# Patient Record
Sex: Female | Born: 1990 | Race: Black or African American | Hispanic: No | Marital: Single | Smoking: Never smoker
Health system: Southern US, Community
[De-identification: ages and names within clinical notes are randomized; demographics above are authoritative.]

## PROBLEM LIST (undated history)

## (undated) ENCOUNTER — Inpatient Hospital Stay (HOSPITAL_COMMUNITY): Payer: Self-pay

## (undated) DIAGNOSIS — O0990 Supervision of high risk pregnancy, unspecified, unspecified trimester: Secondary | ICD-10-CM

## (undated) DIAGNOSIS — E119 Type 2 diabetes mellitus without complications: Secondary | ICD-10-CM

## (undated) DIAGNOSIS — I10 Essential (primary) hypertension: Secondary | ICD-10-CM

## (undated) DIAGNOSIS — O24419 Gestational diabetes mellitus in pregnancy, unspecified control: Secondary | ICD-10-CM

## (undated) HISTORY — DX: Supervision of high risk pregnancy, unspecified, unspecified trimester: O09.90

## (undated) HISTORY — PX: NO PAST SURGERIES: SHX2092

---

## 2015-08-20 DIAGNOSIS — O24419 Gestational diabetes mellitus in pregnancy, unspecified control: Secondary | ICD-10-CM

## 2015-08-20 HISTORY — DX: Gestational diabetes mellitus in pregnancy, unspecified control: O24.419

## 2015-08-20 NOTE — L&D Delivery Note (Signed)
Delivery Note Pt progressed nicely through transition without an epidural.  She had a 10 minute 2nd stage. At 7:16 AM a viable female was delivered via  (Presentation:OA restituted to LOA;  ).  APGAR: ,8 ; 9 weight pending .  Blood pressures remain elevated but out of the severe range.  Placenta status: intact, delivered via Tomasa BlaseSchultz .  Cord: three vessels with the following complications: none.  Cord pH: N/A  Anesthesia:  none Episiotomy:  N/A Lacerations:  intact Suture Repair: N/A Est. Blood Loss (mL):  50  Mom to postpartum.  Baby to Couplet care / Skin to Skin.  Breast Outpatient circ Contraception: OCP Pt to take placenta home  Clayton BiblesSamantha Gay, Alicia CarolinaNM 07/19/2016, 7:27 AM  I was present for the above and agree.

## 2016-01-19 ENCOUNTER — Encounter (HOSPITAL_COMMUNITY): Payer: Self-pay | Admitting: Emergency Medicine

## 2016-01-19 ENCOUNTER — Emergency Department (HOSPITAL_COMMUNITY): Payer: Medicaid Other

## 2016-01-19 ENCOUNTER — Encounter: Payer: Self-pay | Admitting: Certified Nurse Midwife

## 2016-01-19 ENCOUNTER — Emergency Department (HOSPITAL_COMMUNITY)
Admission: EM | Admit: 2016-01-19 | Discharge: 2016-01-19 | Disposition: A | Discharge status: Home / Self Care | Payer: Medicaid Other | Attending: Emergency Medicine | Admitting: Emergency Medicine

## 2016-01-19 DIAGNOSIS — O26899 Other specified pregnancy related conditions, unspecified trimester: Secondary | ICD-10-CM

## 2016-01-19 DIAGNOSIS — R1033 Periumbilical pain: Secondary | ICD-10-CM | POA: Insufficient documentation

## 2016-01-19 DIAGNOSIS — O26892 Other specified pregnancy related conditions, second trimester: Secondary | ICD-10-CM | POA: Diagnosis present

## 2016-01-19 DIAGNOSIS — Z3A15 15 weeks gestation of pregnancy: Secondary | ICD-10-CM | POA: Insufficient documentation

## 2016-01-19 DIAGNOSIS — R52 Pain, unspecified: Secondary | ICD-10-CM

## 2016-01-19 DIAGNOSIS — R1030 Lower abdominal pain, unspecified: Secondary | ICD-10-CM

## 2016-01-19 LAB — CBC
HCT: 39.2 % (ref 36.0–46.0)
HEMOGLOBIN: 12.8 g/dL (ref 12.0–15.0)
MCH: 26.3 pg (ref 26.0–34.0)
MCHC: 32.7 g/dL (ref 30.0–36.0)
MCV: 80.7 fL (ref 78.0–100.0)
Platelets: 307 10*3/uL (ref 150–400)
RBC: 4.86 MIL/uL (ref 3.87–5.11)
RDW: 12.3 % (ref 11.5–15.5)
WBC: 6.7 10*3/uL (ref 4.0–10.5)

## 2016-01-19 LAB — COMPREHENSIVE METABOLIC PANEL
ALBUMIN: 3.4 g/dL — AB (ref 3.5–5.0)
ALK PHOS: 25 U/L — AB (ref 38–126)
ALT: 23 U/L (ref 14–54)
ANION GAP: 7 (ref 5–15)
AST: 24 U/L (ref 15–41)
BILIRUBIN TOTAL: 0.5 mg/dL (ref 0.3–1.2)
BUN: 6 mg/dL (ref 6–20)
CALCIUM: 9.3 mg/dL (ref 8.9–10.3)
CO2: 23 mmol/L (ref 22–32)
CREATININE: 0.7 mg/dL (ref 0.44–1.00)
Chloride: 105 mmol/L (ref 101–111)
GFR calc Af Amer: >60 mL/min (ref >60)
GFR calc non Af Amer: >60 mL/min (ref >60)
GLUCOSE: 75 mg/dL (ref 65–99)
Potassium: 3.8 mmol/L (ref 3.5–5.1)
SODIUM: 135 mmol/L (ref 135–145)
TOTAL PROTEIN: 6.8 g/dL (ref 6.5–8.1)

## 2016-01-19 LAB — WET PREP, GENITAL
Sperm: NONE SEEN
Trich, Wet Prep: NONE SEEN
Yeast Wet Prep HPF POC: NONE SEEN

## 2016-01-19 LAB — OB RESULTS CONSOLE GC/CHLAMYDIA: Gonorrhea: NEGATIVE

## 2016-01-19 LAB — URINALYSIS, ROUTINE W REFLEX MICROSCOPIC
BILIRUBIN URINE: NEGATIVE
Glucose, UA: NEGATIVE mg/dL
HGB URINE DIPSTICK: NEGATIVE
KETONES UR: NEGATIVE mg/dL
Leukocytes, UA: NEGATIVE
Nitrite: NEGATIVE
PH: 7.5 (ref 5.0–8.0)
Protein, ur: NEGATIVE mg/dL
SPECIFIC GRAVITY, URINE: 1.01 (ref 1.005–1.030)

## 2016-01-19 LAB — HCG, QUANTITATIVE, PREGNANCY: hCG, Beta Chain, Quant, S: 78410 m[IU]/mL — ABNORMAL HIGH (ref <5)

## 2016-01-19 LAB — LIPASE, BLOOD: Lipase: 29 U/L (ref 11–51)

## 2016-01-19 MED ORDER — METRONIDAZOLE 500 MG PO TABS: 500.0000 mg | ORAL_TABLET | Freq: Two times a day (BID) | ORAL | Status: DC

## 2016-01-19 NOTE — ED Provider Notes (Signed)
CSN: [id number]     Arrival date & time 01/19/16  1112 History   First MD Initiated Contact with Patient 01/19/16 1223     Chief Complaint  Patient presents with  . Abdominal Pain     (Consider location/radiation/quality/duration/timing/severity/associated sxs/prior Treatment) HPI Comments: Patient is a previously healthy 25 year old pregnant female who presents with lower abdominal pain. Patient states she has had worsening, constant, crampy abdominal pain over the past 2-3 days. The pain does not radiate. Patient denies any abnormal vaginal discharge or bleeding. Patient reports that on the way here patient coughed which worsened the pain increase the pressure. Patient denies any nausea, vomiting, diarrhea. Patient reports that she is [redacted] weeks pregnant, however she has not yet seen an OB/GYN. She has no appointment in 2 weeks. Patient has had a normal appetite. Patient denies any urinary symptoms. Patient has not taken any medications at home for this problem. Patient has been had 2 weeks of sinus congestion and productive cough with clear mucous. Patient has been taking saline solution per recommendation of her OBGYN nurse when she called the office. Patient denies any chest pain, shortness of breath, fevers.  Patient is a 25 y.o. female presenting with abdominal pain. The history is provided by the patient.  Abdominal Pain Associated symptoms: no chest pain, no chills, no diarrhea, no dysuria, no fever, no nausea, no shortness of breath, no sore throat and no vomiting     History reviewed. No pertinent past medical history. History reviewed. No pertinent past surgical history. History reviewed. No pertinent family history. Social History  Substance Use Topics  . Smoking status: Never Smoker   . Smokeless tobacco: None  . Alcohol Use: No   OB History    Gravida Para Term Preterm AB TAB SAB Ectopic Multiple Living   1              Review of Systems  Constitutional: Negative for fever  and chills.  HENT: Negative for facial swelling and sore throat.   Respiratory: Negative for shortness of breath.   Cardiovascular: Negative for chest pain.  Gastrointestinal: Positive for abdominal pain. Negative for nausea, vomiting and diarrhea.  Genitourinary: Negative for dysuria.  Musculoskeletal: Negative for back pain.  Skin: Negative for rash and wound.  Neurological: Negative for headaches.  Psychiatric/Behavioral: The patient is not nervous/anxious.       Allergies  Review of patient's allergies indicates no known allergies.  Home Medications   Prior to Admission medications   Medication Sig Start Date End Date Taking? Authorizing Provider  metroNIDAZOLE (FLAGYL) 500 MG tablet Take 1 tablet (500 mg total) by mouth 2 (two) times daily. 01/19/16   Emi Holes, PA-C  Prenatal Vit-Fe Fumarate-FA (PRENATAL MULTIVITAMIN) TABS tablet Take 1 tablet by mouth daily at 12 noon.   Yes Historical Provider, MD   BP 115/92 mmHg  Pulse 60  Temp(Src) 97.9 F (36.6 C) (Oral)  Resp 16  Ht 5' 7.5" (1.715 m)  Wt 88.451 kg  BMI 30.07 kg/m2  SpO2 100%  LMP 09/28/2015 Physical Exam  Constitutional: She appears well-developed and well-nourished. No distress.  HENT:  Head: Normocephalic and atraumatic.  Mouth/Throat: Oropharynx is clear and moist. No oropharyngeal exudate.  Eyes: Conjunctivae are normal. Pupils are equal, round, and reactive to light. Right eye exhibits no discharge. Left eye exhibits no discharge. No scleral icterus.  Neck: Normal range of motion. Neck supple. No thyromegaly present.  Cardiovascular: Normal rate, regular rhythm, normal heart sounds and intact distal  pulses.  Exam reveals no gallop and no friction rub.   No murmur heard. Pulmonary/Chest: Effort normal and breath sounds normal. No stridor. No respiratory distress. She has no wheezes. She has no rales.  Abdominal: Soft. Bowel sounds are normal. She exhibits no distension. There is tenderness in the  periumbilical area and suprapubic area. There is no rebound, no guarding and no CVA tenderness.    Musculoskeletal: She exhibits no edema.  Lymphadenopathy:    She has no cervical adenopathy.  Neurological: She is alert. Coordination normal.  Skin: Skin is warm and dry. No rash noted. She is not diaphoretic. No pallor.  Psychiatric: She has a normal mood and affect.  Nursing note and vitals reviewed.   ED Course  Procedures (including critical care time) Labs Review Labs Reviewed  WET PREP, GENITAL - Abnormal; Notable for the following:    Clue Cells Wet Prep HPF POC PRESENT (*)    WBC, Wet Prep HPF POC MANY (*)    All other components within normal limits  COMPREHENSIVE METABOLIC PANEL - Abnormal; Notable for the following:    Albumin 3.4 (*)    Alkaline Phosphatase 25 (*)    All other components within normal limits  HCG, QUANTITATIVE, PREGNANCY - Abnormal; Notable for the following:    hCG, Beta Chain, Quant, S 16109 (*)    All other components within normal limits  URINE CULTURE  LIPASE, BLOOD  CBC  URINALYSIS, ROUTINE W REFLEX MICROSCOPIC (NOT AT [facility])  RPR  HIV ANTIBODY (ROUTINE TESTING)  GC/CHLAMYDIA PROBE AMP ([place]) NOT AT [facility]    Imaging Review US Ob Limited  01/19/2016  CLINICAL DATA:  Lower abdominal pain and cramping for 3 days. Gestational age by LMP of 16 weeks 1 day. EXAM: LIMITED OBSTETRIC ULTRASOUND FINDINGS: Number of Fetuses:  Single Heart Rate:  162 bpm Movement:  Yes Presentation: Variable Placental Location: Anterior Previa: Complete placenta previa noted Amniotic Fluid (Subjective):  Within normal limits. BPD:  2.6cm 14w  4d MATERNAL FINDINGS: Cervix:  Appears closed. Uterus/Adnexae: No abnormality visualized. Ovaries not directly visualized, however no adnexal mass or free fluid demonstrated. IMPRESSION: Single living IUP at approximately [redacted] weeks gestational age. No acute maternal abnormality visualized. Complete placenta previa noted, which may be  related to early gestational age. Recommend followup at time of second trimester complete OB ultrasound exam. This exam is performed on an emergent basis and does not comprehensively evaluate fetal size, dating, or anatomy; follow-up complete OB US is recommended. Electronically Signed   By: Myles Rosenthal M.D.   On: 01/19/2016 17:17   I have personally reviewed and evaluated these images and lab results as part of my medical decision-making.   EKG Interpretation None      MDM   CBC unremarkable. CMP shows alkaline phosphatase 25, albumin 3.4. Lipase 29. UA negative, urine culture sent. GC chlamydia, RPR, HIV sent. Patient advised she would be called for any positive results in 2-3 days at which time treatment would be arranged. HCG Quant [id number]. Wet prep shows clue cells and many white blood cells. OB ultrasound shows single living IUP at approximately 53 weeks of age, no acute maternal abnormality visualized, complete placenta previa noted which may be related to early gestational age; recommend follow-up at time of second trimester complete OB ultrasound exam. I will treat patient for possible vaginitis with Flagyl. Patient to follow-up with OB/GYN. Patient vitals stable throughout ED course and discharged in satisfactory condition. I discussed patient with Dr. Effie Shy who  is in agreement with plan.  Final diagnoses:  Pregnancy related abdominal pain of lower quadrant, antepartum        Emi Holeslexandra M Latrease Kunde, PA-C 01/19/16 1855  Mancel BaleElliott Wentz, MD 01/20/16 [phone]

## 2016-01-19 NOTE — ED Notes (Signed)
Patient getting undressed and into a gown at this time 

## 2016-01-19 NOTE — Discharge Instructions (Signed)
Medications: Flagyl  Treatment: Take Flagyl as prescribed for 1 week. Do not drink alcohol while taking this medication. You may take Tylenol over-the-counter for your pain. You will be called in 2-3 days if any of your cultures returned positive.  Follow-up: Please follow-up with your OB/GYN doctor for follow-up of today's visit and further evaluation of your pain. Please return [facility] if you develop any new or worsening symptoms.

## 2016-01-19 NOTE — ED Notes (Signed)
Chaperoned Gordy CouncilmanAlexandra, PA with pelvic examination and collection of vaginal samples

## 2016-01-19 NOTE — ED Notes (Signed)
Pt returned from ultrasound. Will update patient when results come back.

## 2016-01-19 NOTE — ED Notes (Signed)
Pt here with lower abdominal pain x 2 days. Pt is [redacted] weeks pregnant with regular prenatal care. Pt denies n/v/d/ fever. Pt Denies vaginal discharge ot bleeding.

## 2016-01-20 LAB — URINE CULTURE: Culture: NO GROWTH

## 2016-01-20 LAB — RPR: RPR Ser Ql: NONREACTIVE

## 2016-01-20 LAB — HIV ANTIBODY (ROUTINE TESTING W REFLEX): HIV Screen 4th Generation wRfx: NONREACTIVE

## 2016-01-22 LAB — GC/CHLAMYDIA PROBE AMP (~~LOC~~) NOT AT ARMC
Chlamydia: NEGATIVE
Neisseria Gonorrhea: NEGATIVE

## 2016-02-02 ENCOUNTER — Encounter: Payer: Self-pay | Admitting: Certified Nurse Midwife

## 2016-02-02 ENCOUNTER — Ambulatory Visit (INDEPENDENT_AMBULATORY_CARE_PROVIDER_SITE_OTHER): Payer: BLUE CROSS/BLUE SHIELD | Admitting: Certified Nurse Midwife

## 2016-02-02 DIAGNOSIS — O0932 Supervision of pregnancy with insufficient antenatal care, second trimester: Secondary | ICD-10-CM

## 2016-02-02 DIAGNOSIS — Z3492 Encounter for supervision of normal pregnancy, unspecified, second trimester: Secondary | ICD-10-CM | POA: Diagnosis not present

## 2016-02-02 DIAGNOSIS — O0990 Supervision of high risk pregnancy, unspecified, unspecified trimester: Secondary | ICD-10-CM | POA: Insufficient documentation

## 2016-02-02 DIAGNOSIS — Z3402 Encounter for supervision of normal first pregnancy, second trimester: Secondary | ICD-10-CM

## 2016-02-02 LAB — POCT URINALYSIS DIPSTICK
Bilirubin, UA: NEGATIVE
Blood, UA: NEGATIVE
Glucose, UA: NEGATIVE
Ketones, UA: NEGATIVE
LEUKOCYTES UA: NEGATIVE
Nitrite, UA: NEGATIVE
PH UA: 7.5
PROTEIN UA: NEGATIVE
Spec Grav, UA: 1.015
UROBILINOGEN UA: NEGATIVE

## 2016-02-02 NOTE — Progress Notes (Signed)
Subjective:    Alicia Gay is being seen today for her first obstetrical visit.  This is not a planned pregnancy. She is at 7149w4d gestation. Her obstetrical history is significant for normal. Relationship with FOB: significant other, living together, [place]. Patient does intend to breast feed. Pregnancy history fully reviewed.  The information documented in the HPI was reviewed and verified.  Menstrual History: OB History    Gravida Para Term Preterm AB TAB SAB Ectopic Multiple Living   1               Menarche age: 25 years of age   Patient's last menstrual period was 09/28/2015 (lmp unknown).    No past medical history on file.  No past surgical history on file.   (Not in a hospital admission) No Known Allergies  Social History  Substance Use Topics  . Smoking status: Never Smoker   . Smokeless tobacco: Not on file  . Alcohol Use: No    No family history on file.   Review of Systems Constitutional: negative for weight loss Gastrointestinal: negative for vomiting Genitourinary:negative for genital lesions and vaginal discharge and dysuria Musculoskeletal:negative for back pain Behavioral/Psych: negative for abusive relationship, depression, illegal drug usage and tobacco use    Objective:    LMP 09/28/2015 (LMP Unknown) General Appearance:    Alert, cooperative, no distress, appears stated age  Head:    Normocephalic, without obvious abnormality, atraumatic  Eyes:    PERRL, conjunctiva/corneas clear, EOM's intact, fundi    benign, both eyes  Ears:    Normal TM's and external ear canals, both ears  Nose:   Nares normal, septum midline, mucosa normal, no drainage    or sinus tenderness  Throat:   Lips, mucosa, and tongue normal; teeth and gums normal  Neck:   Supple, symmetrical, trachea midline, no adenopathy;    thyroid:  no enlargement/tenderness/nodules; no carotid   bruit or JVD  Back:     Symmetric, no curvature, ROM normal, no CVA tenderness  Lungs:      Clear to auscultation bilaterally, respirations unlabored  Chest Wall:    No tenderness or deformity   Heart:    Regular rate and rhythm, S1 and S2 normal, no murmur, rub   or gallop  Breast Exam:    No tenderness, masses, or nipple abnormality  Abdomen:     Soft, non-tender, bowel sounds active all four quadrants,    no masses, no organomegaly  Genitalia:    Normal female without lesion, discharge or tenderness  Extremities:   Extremities normal, atraumatic, no cyanosis or edema  Pulses:   2+ and symmetric all extremities  Skin:   Skin color, texture, turgor normal, no rashes or lesions  Lymph nodes:   Cervical, supraclavicular, and axillary nodes normal  Neurologic:   CNII-XII intact, normal strength, sensation and reflexes    throughout     Cervix:  Long, thick, closed and posterior.  FHR: 155   FH: roughly 18 cm       Lab Review Urine pregnancy test Labs reviewed yes Radiologic studies reviewed yes Assessment:    Pregnancy at 7249w4d weeks   Late to prenatal care  Plan:      Prenatal vitamins.  Counseling provided regarding continued use of seat belts, cessation of alcohol consumption, smoking or use of illicit drugs; infection precautions i.e., influenza/TDAP immunizations, toxoplasmosis,CMV, parvovirus, listeria and varicella; workplace safety, exercise during pregnancy; routine dental care, safe medications, sexual activity, hot tubs, saunas, pools, travel,  caffeine use, fish and methlymercury, potential toxins, hair treatments, varicose veins Weight gain recommendations per IOM guidelines reviewed: underweight/BMI< 18.5--> gain 28 - 40 lbs; normal weight/BMI 18.5 - 24.9--> gain 25 - 35 lbs; overweight/BMI 25 - 29.9--> gain 15 - 25 lbs; obese/BMI >30->gain  11 - 20 lbs Problem list reviewed and updated. FIRST/CF mutation testing/NIPT/QUAD SCREEN/fragile X/Ashkenazi Jewish population testing/Spinal muscular atrophy discussed: ordered. Role of ultrasound in pregnancy discussed;  fetal survey: ordered. Amniocentesis discussed: not indicated. VBAC calculator score: VBAC consent form provided No orders of the defined types were placed in this encounter.   Orders Placed This Encounter  Procedures  . US OB Comp + 14 Wk    Standing Status: Future     Number of Occurrences:      Standing Expiration Date: 04/03/2017    Order Specific Question:  Reason for Exam (SYMPTOM  OR DIAGNOSIS REQUIRED)    Answer:  fetal anatomy scan    Order Specific Question:  Preferred imaging location?    Answer:  Internal  . TSH  . Hemoglobinopathy evaluation  . Varicella zoster antibody, IgG  . Prenatal Profile I  . POCT urinalysis dipstick    Follow up in 4 weeks. 50% of 30 min visit spent on counseling and coordination of care.

## 2016-02-05 LAB — PRENATAL PROFILE I(LABCORP)
Antibody Screen: NEGATIVE
BASOS ABS: 0 10*3/uL (ref 0.0–0.2)
Basos: 0 %
EOS (ABSOLUTE): 0.1 10*3/uL (ref 0.0–0.4)
Eos: 1 %
Hematocrit: 37.7 % (ref 34.0–46.6)
Hemoglobin: 12.2 g/dL (ref 11.1–15.9)
Hepatitis B Surface Ag: NEGATIVE
Immature Grans (Abs): 0 10*3/uL (ref 0.0–0.1)
Immature Granulocytes: 0 %
LYMPHS ABS: 1.1 10*3/uL (ref 0.7–3.1)
Lymphs: 18 %
MCH: 26.7 pg (ref 26.6–33.0)
MCHC: 32.4 g/dL (ref 31.5–35.7)
MCV: 83 fL (ref 79–97)
Monocytes Absolute: 0.5 10*3/uL (ref 0.1–0.9)
Monocytes: 8 %
NEUTROS ABS: 4.5 10*3/uL (ref 1.4–7.0)
Neutrophils: 73 %
PLATELETS: 302 10*3/uL (ref 150–379)
RBC: 4.57 x10E6/uL (ref 3.77–5.28)
RDW: 13.2 % (ref 12.3–15.4)
RPR Ser Ql: NONREACTIVE
Rh Factor: POSITIVE
Rubella Antibodies, IGG: 9.68 index (ref >0.99)
WBC: 6.2 10*3/uL (ref 3.4–10.8)

## 2016-02-05 LAB — TSH: TSH: 2.27 u[IU]/mL (ref 0.450–4.500)

## 2016-02-05 LAB — HEMOGLOBINOPATHY EVALUATION
HEMOGLOBIN F QUANTITATION: 0 % (ref 0.0–2.0)
HGB A: 97.6 % (ref 94.0–98.0)
HGB C: 0 %
HGB S: 0 %
Hemoglobin A2 Quantitation: 2.4 % (ref 0.7–3.1)

## 2016-02-05 LAB — NUSWAB VG, CANDIDA 6SP
CANDIDA ALBICANS, NAA: POSITIVE — AB
CANDIDA GLABRATA, NAA: NEGATIVE
CANDIDA KRUSEI, NAA: NEGATIVE
CANDIDA PARAPSILOSIS, NAA: NEGATIVE
CANDIDA TROPICALIS, NAA: NEGATIVE
Candida lusitaniae, NAA: NEGATIVE
Trich vag by NAA: NEGATIVE

## 2016-02-05 LAB — VARICELLA ZOSTER ANTIBODY, IGG: Varicella zoster IgG: 1088 index (ref >165)

## 2016-02-07 ENCOUNTER — Other Ambulatory Visit: Payer: Self-pay | Admitting: Certified Nurse Midwife

## 2016-02-07 DIAGNOSIS — B373 Candidiasis of vulva and vagina: Secondary | ICD-10-CM

## 2016-02-07 DIAGNOSIS — B3731 Acute candidiasis of vulva and vagina: Secondary | ICD-10-CM

## 2016-02-07 MED ORDER — TERCONAZOLE 0.8 % VA CREA: 1.0000 | TOPICAL_CREAM | Freq: Every day | VAGINAL | Status: DC

## 2016-02-07 MED ORDER — FLUCONAZOLE 100 MG PO TABS: 100.0000 mg | ORAL_TABLET | Freq: Once | ORAL | Status: DC

## 2016-02-08 LAB — PAP IG W/ RFLX HPV ASCU: PAP SMEAR COMMENT: 0

## 2016-02-08 LAB — HPV DNA PROBE HIGH RISK, AMPLIFIED: HPV, HIGH-RISK: POSITIVE — AB

## 2016-02-13 ENCOUNTER — Other Ambulatory Visit: Payer: Self-pay | Admitting: Certified Nurse Midwife

## 2016-02-15 ENCOUNTER — Ambulatory Visit (INDEPENDENT_AMBULATORY_CARE_PROVIDER_SITE_OTHER): Payer: BLUE CROSS/BLUE SHIELD

## 2016-02-15 DIAGNOSIS — Z3402 Encounter for supervision of normal first pregnancy, second trimester: Secondary | ICD-10-CM

## 2016-02-15 DIAGNOSIS — Z36 Encounter for antenatal screening of mother: Secondary | ICD-10-CM

## 2016-02-21 ENCOUNTER — Other Ambulatory Visit (HOSPITAL_COMMUNITY): Payer: Self-pay | Admitting: Certified Nurse Midwife

## 2016-02-22 ENCOUNTER — Other Ambulatory Visit: Payer: Self-pay | Admitting: Certified Nurse Midwife

## 2016-03-01 ENCOUNTER — Ambulatory Visit (INDEPENDENT_AMBULATORY_CARE_PROVIDER_SITE_OTHER): Payer: BLUE CROSS/BLUE SHIELD | Admitting: Obstetrics

## 2016-03-01 VITALS — BP 144/89 | HR 105 | Temp 98.2°F | Wt 199.0 lb

## 2016-03-01 DIAGNOSIS — Z3402 Encounter for supervision of normal first pregnancy, second trimester: Secondary | ICD-10-CM

## 2016-03-01 LAB — POCT URINALYSIS DIPSTICK
BILIRUBIN UA: NEGATIVE
Blood, UA: NEGATIVE
GLUCOSE UA: NORMAL
KETONES UA: NEGATIVE
LEUKOCYTES UA: NEGATIVE
Nitrite, UA: NEGATIVE
PH UA: 7
Protein, UA: NEGATIVE
Spec Grav, UA: <=1.005
Urobilinogen, UA: NEGATIVE

## 2016-03-01 MED ORDER — PRENATE MINI 29-0.6-0.4-350 MG PO CAPS: 1.0000 | ORAL_CAPSULE | Freq: Every day | ORAL | Status: DC

## 2016-03-04 ENCOUNTER — Encounter: Payer: Self-pay | Admitting: Obstetrics

## 2016-03-04 NOTE — Progress Notes (Signed)
Subjective:    Alicia Gay is a 25 y.o. female being seen today for her obstetrical visit. She is at 3094w0d gestation. Patient reports: no complaints . Fetal movement: normal.  Problem List Items Addressed This Visit    Supervision of normal first pregnancy in second trimester - Primary   Relevant Medications   Prenat w/o A-FeCbn-Meth-FA-DHA (PRENATE MINI) 29-0.6-0.4-350 MG CAPS   Other Relevant Orders   POCT urinalysis dipstick (Completed)     Patient Active Problem List   Diagnosis Date Noted  . Supervision of normal first pregnancy in second trimester 02/02/2016   Objective:    BP 144/89 mmHg  Pulse 105  Temp(Src) 98.2 F (36.8 C)  Wt 199 lb (90.266 kg)  LMP 09/28/2015 (LMP Unknown) FHT: 150 BPM  Uterine Size: size equals dates     Assessment:    Pregnancy @ 8194w0d    Plan:    Signs and symptoms of preterm labor: discussed.  Labs, problem list reviewed and updated 2 hr GTT planned Follow up in 4 weeks.

## 2016-03-29 ENCOUNTER — Encounter: Payer: BLUE CROSS/BLUE SHIELD | Admitting: Certified Nurse Midwife

## 2016-04-04 ENCOUNTER — Ambulatory Visit (INDEPENDENT_AMBULATORY_CARE_PROVIDER_SITE_OTHER): Payer: BLUE CROSS/BLUE SHIELD | Admitting: Certified Nurse Midwife

## 2016-04-04 DIAGNOSIS — O132 Gestational [pregnancy-induced] hypertension without significant proteinuria, second trimester: Secondary | ICD-10-CM

## 2016-04-04 DIAGNOSIS — Z3402 Encounter for supervision of normal first pregnancy, second trimester: Secondary | ICD-10-CM

## 2016-04-04 DIAGNOSIS — O162 Unspecified maternal hypertension, second trimester: Secondary | ICD-10-CM | POA: Insufficient documentation

## 2016-04-04 LAB — POCT URINALYSIS DIPSTICK
BILIRUBIN UA: NEGATIVE
GLUCOSE UA: NEGATIVE
KETONES UA: NEGATIVE
Leukocytes, UA: NEGATIVE
Nitrite, UA: NEGATIVE
Protein, UA: NEGATIVE
RBC UA: NEGATIVE
UROBILINOGEN UA: NEGATIVE
pH, UA: 7

## 2016-04-04 MED ORDER — NIFEDIPINE ER OSMOTIC RELEASE 30 MG PO TB24: 30.0000 mg | ORAL_TABLET | Freq: Every day | ORAL | 5 refills | Status: DC

## 2016-04-04 MED ORDER — ASPIRIN 81 MG PO CHEW: 81.0000 mg | CHEWABLE_TABLET | Freq: Every day | ORAL | 5 refills | Status: DC

## 2016-04-04 NOTE — Progress Notes (Signed)
Subjective:    Alicia Gay is a 25 y.o. female being seen today for her obstetrical visit. She is at 2519w3d gestation. Patient reports: no complaints . Fetal movement: normal.  Problem List Items Addressed This Visit      Other   Supervision of normal first pregnancy in second trimester   Relevant Orders   POCT urinalysis dipstick (Completed)   Elevated blood pressure affecting pregnancy in second trimester, antepartum   Relevant Medications   NIFEdipine (PROCARDIA XL) 30 MG 24 hr tablet   aspirin 81 MG chewable tablet   Other Relevant Orders   Lactate dehydrogenase   Creatinine, serum   CBC   ALT   AST   Pathologist smear review   Protein / creatinine ratio, urine   Creatinine clearance, urine, 24 hour   Protein, urine, 24 hour   AMB referral to maternal fetal medicine   US MFM OB DETAIL +14 WK    Other Visit Diagnoses   None.    Patient Active Problem List   Diagnosis Date Noted  . Elevated blood pressure affecting pregnancy in second trimester, antepartum 04/04/2016  . Supervision of normal first pregnancy in second trimester 02/02/2016   Objective:    BP (!) 146/93   Pulse 64   Temp 98.5 F (36.9 C)   Wt 203 lb (92.1 kg)   LMP 09/28/2015 (LMP Unknown)   BMI 31.33 kg/m  FHT: 155 BPM  Uterine Size: 25 cm and size equals dates     Assessment:    Pregnancy @ 7819w3d    Elevated blood pressure: base line labs, meds, MFM consult done  Plan:    OBGCT: discussed and ordered for next visit. Signs and symptoms of preterm labor: discussed. elevated blood pressure X2 no maternal hx of HTN: started on procardia xl, baby asa, MFM consult  Labs, problem list reviewed and updated 2 hr GTT planned Follow up in 2 weeks.

## 2016-04-04 NOTE — Patient Instructions (Addendum)
Before [facility] Before your baby arrives it is important to:  Have all of the supplies that you will need to care for your baby.  Know where to go if there is an emergency.  Discuss the baby's arrival with other family members. WHAT SUPPLIES WILL I NEED? It is recommended that you have the following supplies: Large Items  Crib.  Crib mattress.  Rear-facing infant car seat. If possible, have a trained professional check to make sure that it is installed correctly. Feeding  6-8 bottles that are 4-5 oz in size.  6-8 nipples.  Bottle brush.  Sterilizer, or a large pan or kettle with a lid.  A way to boil and cool water.  If you will be breastfeeding:  Breast pump.  Nipple cream.  Nursing bra.  Breast pads.  Breast shields.  If you will be formula feeding:  Formula.  Measuring cups.  Measuring spoons. Bathing  Mild baby soap and baby shampoo.  Petroleum jelly.  Soft cloth towel and washcloth.  Hooded towel.  Cotton balls.  Bath basin. Other Supplies  Rectal thermometer.  Bulb syringe.  Baby wipes or washcloths for diaper changes.  Diaper bag.  Changing pad.  Clothing, including one-piece outfits and pajamas.  Baby nail clippers.  Receiving blankets.  Mattress pad and sheets for the crib.  Night-light for the baby's room.  Baby monitor.  2 or 3 pacifiers.  Either 24-36 cloth diapers and waterproof diaper covers or a box of disposable diapers. You may need to use as many as 10-12 diapers per day. HOW DO I PREPARE FOR AN EMERGENCY? Prepare for an emergency by:  Knowing how to get to the nearest hospital.  Listing the phone numbers of your baby's health care providers near your home phone and in your cell phone. HOW DO I PREPARE MY FAMILY?  Decide how to handle visitors.  If you have other children:  Talk with them about the baby coming home. Ask them how they feel about it.  Read a book together about being a new big  brother or sister.  Find ways to let them help you prepare for the new baby.  Have someone ready to care for them while you are in the hospital.   This information is not intended to replace advice given to you by your health care provider. Make sure you discuss any questions you have with your health care provider.   Document Released: 07/18/2008 Document Revised: 12/20/2014 Document Reviewed: 07/13/2014 Elsevier Interactive Patient Education 2016 [employer]. How a Baby Grows During Pregnancy Pregnancy begins when a female's sperm enters a female's egg (fertilization). This happens in one of the tubes (fallopian tubes) that connect the ovaries to the womb (uterus). The fertilized egg is called an embryo until it reaches 10 weeks. From 10 weeks until birth, it is called a fetus. The fertilized egg moves down the fallopian tube to the uterus. Then it implants into the lining of the uterus and begins to grow. The developing fetus receives oxygen and nutrients through the pregnant woman's bloodstream and the tissues that grow (placenta) to support the fetus. The placenta is the life support system for the fetus. It provides nutrition and removes waste. Learning as much as you can about your pregnancy and how your baby is developing can help you enjoy the experience. It can also make you aware of when there might be a problem and when to ask questions. HOW LONG DOES A TYPICAL PREGNANCY LAST? A pregnancy  usually lasts 280 days, or about 40 weeks. Pregnancy is divided into three trimesters:  First trimester: 0-13 weeks.  Second trimester: 14-27 weeks.  Third trimester: 28-40 weeks. The day when your baby is considered ready to be born (full term) is your estimated date of delivery. HOW DOES MY BABY DEVELOP MONTH BY MONTH? First month  The fertilized egg attaches to the inside of the uterus.  Some cells will form the placenta. Others will form the fetus.  The arms, legs, brain, spinal cord,  lungs, and heart begin to develop.  At the end of the first month, the heart begins to beat. Second month  The bones, inner ear, eyelids, hands, and feet form.  The genitals develop.  By the end of 8 weeks, all major organs are developing. Third month  All of the internal organs are forming.  Teeth develop below the gums.  Bones and muscles begin to grow. The spine can flex.  The skin is transparent.  Fingernails and toenails begin to form.  Arms and legs continue to grow longer, and hands and feet develop.  The fetus is about 3 in (7.6 cm) long. Fourth month  The placenta is completely formed.  The external sex organs, neck, outer ear, eyebrows, eyelids, and fingernails are formed.  The fetus can hear, swallow, and move its arms and legs.  The kidneys begin to produce urine.  The skin is covered with a white waxy coating (vernix) and very fine hair (lanugo). Fifth month  The fetus moves around more and can be felt for the first time (quickening).  The fetus starts to sleep and wake up and may begin to suck its finger.  The nails grow to the end of the fingers.  The organ in the digestive system that makes bile (gallbladder) functions and helps to digest the nutrients.  If your baby is a girl, eggs are present in her ovaries. If your baby is a boy, testicles start to move down into his scrotum. Sixth month  The lungs are formed, but the fetus is not yet able to breathe.  The eyes open. The brain continues to develop.  Your baby has fingerprints and toe prints. Your baby's hair grows thicker.  At the end of the second trimester, the fetus is about 9 in (22.9 cm) long. Seventh month  The fetus kicks and stretches.  The eyes are developed enough to sense changes in light.  The hands can make a grasping motion.  The fetus responds to sound. Eighth month  All organs and body systems are fully developed and functioning.  Bones harden and taste buds  develop. The fetus may hiccup.  Certain areas of the brain are still developing. The skull remains soft. Ninth month  The fetus gains about  lb (0.23 kg) each week.  The lungs are fully developed.  Patterns of sleep develop.  The fetus's head typically moves into a head-down position (vertex) in the uterus to prepare for birth. If the buttocks move into a vertex position instead, the baby is breech.  The fetus weighs 6-9 lbs (2.72-4.08 kg) and is 19-20 in (48.26-50.8 cm) long. WHAT CAN I DO TO HAVE A HEALTHY PREGNANCY AND HELP MY BABY DEVELOP? Eating and Drinking  Eat a healthy diet.  Talk with your health care provider to make sure that you are getting the nutrients that you and your baby need.  Visit www.BuildDNA.es to learn about creating a healthy diet.  Gain a healthy amount of weight  during pregnancy as advised by your health care provider. This is usually 25-35 pounds. You may need to:  Gain more if you were underweight before getting pregnant or if you are pregnant with more than one baby.  Gain less if you were overweight or obese when you got pregnant. Medicines and Vitamins  Take prenatal vitamins as directed by your health care provider. These include vitamins such as folic acid, iron, calcium, and vitamin D. They are important for healthy development.  Take medicines only as directed by your health care provider. Read labels and ask a pharmacist or your health care provider whether over-the-counter medicines, supplements, and prescription drugs are safe to take during pregnancy. Activities  Be physically active as advised by your health care provider. Ask your health care provider to recommend activities that are safe for you to do, such as walking or swimming.  Do not participate in strenuous or extreme sports. Lifestyle  Do not drink alcohol.  Do not use any tobacco products, including cigarettes, chewing tobacco, or electronic cigarettes. If you need  help quitting, ask your health care provider.  Do not use illegal drugs. Safety  Avoid exposure to mercury, lead, or other heavy metals. Ask your health care provider about common sources of these heavy metals.  Avoid listeria infection during pregnancy. Follow these precautions:  Do not eat soft cheeses or deli meats.  Do not eat hot dogs unless they have been warmed up to the point of steaming, such as in the microwave oven.  Do not drink unpasteurized milk.  Avoid toxoplasmosis infection during pregnancy. Follow these precautions:  Do not change your cat's litter box, if you have a cat. Ask someone else to do this for you.  Wear gardening gloves while working in the yard. General Instructions  Keep all follow-up visits as directed by your health care provider. This is important. This includes prenatal care and screening tests.  Manage any chronic health conditions. Work closely with your health care provider to keep conditions, such as diabetes, under control. HOW DO I KNOW IF MY BABY IS DEVELOPING WELL? At each prenatal visit, your health care provider will do several different tests to check on your health and keep track of your baby's development. These include:  Fundal height.  Your health care provider will measure your growing belly from top to bottom using a tape measure.  Your health care provider will also feel your belly to determine your baby's position.  Heartbeat.  An ultrasound in the first trimester can confirm pregnancy and show a heartbeat, depending on how far along you are.  Your health care provider will check your baby's heart rate at every prenatal visit.  As you get closer to your delivery date, you may have regular fetal heart rate monitoring to make sure that your baby is not in distress.  Second trimester ultrasound.  This ultrasound checks your baby's development. It also indicates your baby's gender. WHAT SHOULD I DO IF I HAVE CONCERNS ABOUT  MY BABY'S DEVELOPMENT? Always talk with your health care provider about any concerns that you may have.   This information is not intended to replace advice given to you by your health care provider. Make sure you discuss any questions you have with your health care provider.   Document Released: 01/22/2008 Document Revised: 04/26/2015 Document Reviewed: 01/12/2014 Elsevier Interactive Patient Education 2016 [employer]. Hypertension During Pregnancy Hypertension, or high blood pressure, is when there is extra pressure inside your blood vessels that  carry blood from the heart to the rest of your body (arteries). It can happen at any time in life, including pregnancy. Hypertension during pregnancy can cause problems for you and your baby. Your baby might not weigh as much as he or she should at birth or might be born early (premature). Very bad cases of hypertension during pregnancy can be life-threatening.  Different types of hypertension can occur during pregnancy. These include:  Chronic hypertension. This happens when a woman has hypertension before pregnancy and it continues during pregnancy.  Gestational hypertension. This is when hypertension develops during pregnancy.  Preeclampsia or toxemia of pregnancy. This is a very serious type of hypertension that develops only during pregnancy. It affects the whole body and can be very dangerous for both mother and baby.  Gestational hypertension and preeclampsia usually go away after your baby is born. Your blood pressure will likely stabilize within 6 weeks. Women who have hypertension during pregnancy have a greater chance of developing hypertension later in life or with future pregnancies. RISK FACTORS There are certain factors that make it more likely for you to develop hypertension during pregnancy. These include:  Having hypertension before pregnancy.  Having hypertension during a previous pregnancy.  Being overweight.  Being older  than 40 years.  Being pregnant with more than one baby.  Having diabetes or kidney problems. SIGNS AND SYMPTOMS Chronic and gestational hypertension rarely cause symptoms. Preeclampsia has symptoms, which may include:  Increased protein in your urine. Your health care provider will check for this at every prenatal visit.  Swelling of your hands and face.  Rapid weight gain.  Headaches.  Visual changes.  Being bothered by light.  Abdominal pain, especially in the upper right area.  Chest pain.  Shortness of breath.  Increased reflexes.  Seizures. These occur with a more severe form of preeclampsia, called eclampsia. DIAGNOSIS  You may be diagnosed with hypertension during a regular prenatal exam. At each prenatal visit, you may have:  Your blood pressure checked.  A urine test to check for protein in your urine. The type of hypertension you are diagnosed with depends on when you developed it. It also depends on your specific blood pressure reading.  Developing hypertension before 20 weeks of pregnancy is consistent with chronic hypertension.  Developing hypertension after 20 weeks of pregnancy is consistent with gestational hypertension.  Hypertension with increased urinary protein is diagnosed as preeclampsia.  Blood pressure measurements that stay above 496 systolic or 759 diastolic are a sign of severe preeclampsia. TREATMENT Treatment for hypertension during pregnancy varies. Treatment depends on the type of hypertension and how serious it is.  If you take medicine for chronic hypertension, you may need to switch medicines.  Medicines called ACE inhibitors should not be taken during pregnancy.  Low-dose aspirin may be suggested for women who have risk factors for preeclampsia.  If you have gestational hypertension, you may need to take a blood pressure medicine that is safe during pregnancy. Your health care provider will recommend the correct medicine.  If  you have severe preeclampsia, you may need to be in the hospital. Health care providers will watch you and your baby very closely. You also may need to take medicine called magnesium sulfate to prevent seizures and lower blood pressure.  Sometimes, an early delivery is needed. This may be the case if the condition worsens. It would be done to protect you and your baby. The only cure for preeclampsia is delivery.  Your health care  provider may recommend that you take one low-dose aspirin (81 mg) each day to help prevent high blood pressure during your pregnancy if you are at risk for preeclampsia. You may be at risk for preeclampsia if:  You had preeclampsia or eclampsia during a previous pregnancy.  Your baby did not grow as expected during a previous pregnancy.  You experienced preterm birth with a previous pregnancy.  You experienced a separation of the placenta from the uterus (placental abruption) during a previous pregnancy.  You experienced the loss of your baby during a previous pregnancy.  You are pregnant with more than one baby.  You have other medical conditions, such as diabetes or an autoimmune disease. HOME CARE INSTRUCTIONS  Schedule and keep all of your regular prenatal care appointments. This is important.  Take medicines only as directed by your health care provider. Tell your health care provider about all medicines you take.  Eat as little salt as possible.  Get regular exercise.  Do not drink alcohol.  Do not use tobacco products.  Do not drink products with caffeine.  Lie on your left side when resting. SEEK IMMEDIATE MEDICAL CARE IF:  You have severe abdominal pain.  You have sudden swelling in your hands, ankles, or face.  You gain 4 pounds (1.8 kg) or more in 1 week.  You vomit repeatedly.  You have vaginal bleeding.  You do not feel your baby moving as much.  You have a headache.  You have blurred or double vision.  You have muscle  twitching or spasms.  You have shortness of breath.  You have blue fingernails or lips.  You have blood in your urine. MAKE SURE YOU:  Understand these instructions.  Will watch your condition.  Will get help right away if you are not doing well or get worse.   This information is not intended to replace advice given to you by your health care provider. Make sure you discuss any questions you have with your health care provider.   Document Released: 04/23/2011 Document Revised: 08/26/2014 Document Reviewed: 03/04/2013 Elsevier Interactive Patient Education 2016 [place] of Pregnancy The second trimester is from week 13 through week 28, months 4 through 6. The second trimester is often a time when you feel your best. Your body has also adjusted to being pregnant, and you begin to feel better physically. Usually, morning sickness has lessened or quit completely, you may have more energy, and you may have an increase in appetite. The second trimester is also a time when the fetus is growing rapidly. At the end of the sixth month, the fetus is about 9 inches long and weighs about 1 pounds. You will likely begin to feel the baby move (quickening) between 18 and 20 weeks of the pregnancy. BODY CHANGES Your body goes through many changes during pregnancy. The changes vary from woman to woman.   Your weight will continue to increase. You will notice your lower abdomen bulging out.  You may begin to get stretch marks on your hips, abdomen, and breasts.  You may develop headaches that can be relieved by medicines approved by your health care provider.  You may urinate more often because the fetus is pressing on your bladder.  You may develop or continue to have heartburn as a result of your pregnancy.  You may develop constipation because certain hormones are causing the muscles that push waste through your intestines to slow down.  You may develop hemorrhoids or  swollen, bulging veins (varicose veins).  You may have back pain because of the weight gain and pregnancy hormones relaxing your joints between the bones in your pelvis and as a result of a shift in weight and the muscles that support your balance.  Your breasts will continue to grow and be tender.  Your gums may bleed and may be sensitive to brushing and flossing.  Dark spots or blotches (chloasma, mask of pregnancy) may develop on your face. This will likely fade after the baby is born.  A dark line from your belly button to the pubic area (linea nigra) may appear. This will likely fade after the baby is born.  You may have changes in your hair. These can include thickening of your hair, rapid growth, and changes in texture. Some women also have hair loss during or after pregnancy, or hair that feels dry or thin. Your hair will most likely return to normal after your baby is born. WHAT TO EXPECT AT YOUR PRENATAL VISITS During a routine prenatal visit:  You will be weighed to make sure you and the fetus are growing normally.  Your blood pressure will be taken.  Your abdomen will be measured to track your baby's growth.  The fetal heartbeat will be listened to.  Any test results from the previous visit will be discussed. Your health care provider may ask you:  How you are feeling.  If you are feeling the baby move.  If you have had any abnormal symptoms, such as leaking fluid, bleeding, severe headaches, or abdominal cramping.  If you are using any tobacco products, including cigarettes, chewing tobacco, and electronic cigarettes.  If you have any questions. Other tests that may be performed during your second trimester include:  Blood tests that check for:  Low iron levels (anemia).  Gestational diabetes (between 24 and 28 weeks).  Rh antibodies.  Urine tests to check for infections, diabetes, or protein in the urine.  An ultrasound to confirm the proper growth and  development of the baby.  An amniocentesis to check for possible genetic problems.  Fetal screens for spina bifida and Down syndrome.  HIV (human immunodeficiency virus) testing. Routine prenatal testing includes screening for HIV, unless you choose not to have this test. HOME CARE INSTRUCTIONS   Avoid all smoking, herbs, alcohol, and unprescribed drugs. These chemicals affect the formation and growth of the baby.  Do not use any tobacco products, including cigarettes, chewing tobacco, and electronic cigarettes. If you need help quitting, ask your health care provider. You may receive counseling support and other resources to help you quit.  Follow your health care provider's instructions regarding medicine use. There are medicines that are either safe or unsafe to take during pregnancy.  Exercise only as directed by your health care provider. Experiencing uterine cramps is a good sign to stop exercising.  Continue to eat regular, healthy meals.  Wear a good support bra for breast tenderness.  Do not use hot tubs, steam rooms, or saunas.  Wear your seat belt at all times when driving.  Avoid raw meat, uncooked cheese, cat litter boxes, and soil used by cats. These carry germs that can cause birth defects in the baby.  Take your prenatal vitamins.  Take 1500-2000 mg of calcium daily starting at the 20th week of pregnancy until you deliver your baby.  Try taking a stool softener (if your health care provider approves) if you develop constipation. Eat more high-fiber foods, such as fresh vegetables or  fruit and whole grains. Drink plenty of fluids to keep your urine clear or pale yellow.  Take warm sitz baths to soothe any pain or discomfort caused by hemorrhoids. Use hemorrhoid cream if your health care provider approves.  If you develop varicose veins, wear support hose. Elevate your feet for 15 minutes, 3-4 times a day. Limit salt in your diet.  Avoid heavy lifting, wear low heel  shoes, and practice good posture.  Rest with your legs elevated if you have leg cramps or low back pain.  Visit your dentist if you have not gone yet during your pregnancy. Use a soft toothbrush to brush your teeth and be gentle when you floss.  A sexual relationship may be continued unless your health care provider directs you otherwise.  Continue to go to all your prenatal visits as directed by your health care provider. SEEK MEDICAL CARE IF:   You have dizziness.  You have mild pelvic cramps, pelvic pressure, or nagging pain in the abdominal area.  You have persistent nausea, vomiting, or diarrhea.  You have a bad smelling vaginal discharge.  You have pain with urination. SEEK IMMEDIATE MEDICAL CARE IF:   You have a fever.  You are leaking fluid from your vagina.  You have spotting or bleeding from your vagina.  You have severe abdominal cramping or pain.  You have rapid weight gain or loss.  You have shortness of breath with chest pain.  You notice sudden or extreme swelling of your face, hands, ankles, feet, or legs.  You have not felt your baby move in over an hour.  You have severe headaches that do not go away with medicine.  You have vision changes.   This information is not intended to replace advice given to you by your health care provider. Make sure you discuss any questions you have with your health care provider.   Document Released: 07/30/2001 Document Revised: 08/26/2014 Document Reviewed: 10/06/2012 Elsevier Interactive Patient Education 2016 [place].  Pain Relief During Labor and Delivery Everyone experiences pain differently, but labor causes severe pain for many women. The amount of pain you experience during labor and delivery depends on your pain tolerance, contraction strength, and your baby's size and position. There are many ways to prepare for and deal with the pain, including:   Taking prenatal classes to learn about labor and  delivery. The more informed you are, the less anxious and afraid you may be. This can help lessen the pain.  Taking pain-relieving medicine during labor and delivery.  Learning breathing and relaxation techniques.  Taking a shower or bath.  Getting massaged.  Changing positions.  Placing an ice pack on your back. Discuss your pain control options with your health care provider during your prenatal visits.  WHAT ARE THE TWO TYPES OF PAIN-RELIEVING MEDICINES? 1. Analgesics. These are medicines that decrease pain without total loss of feeling or muscle movement. 2. Anesthetics. These are medicines that block all feeling, including pain. There can be minor side effects of both types, such as nausea, trouble concentrating, becoming sleepy, and lowering the heart rate of the baby. However, health care providers are careful to give doses that will not seriously affect the baby.  WHAT ARE THE SPECIFIC TYPES OF ANALGESICS AND ANESTHETICS? Systemic Analgesic Systemic pain medicines affect your whole body rather than focusing pain relief on the area of your body experiencing pain. This type of medicine is given either through an IV tube in your vein or by a  shot (injection) into your muscle. This medicine will lessen your pain but will not stop it completely. It may also make you sleepy, but it will not make you lose consciousness.  Local Anesthetic Local anesthetic isused tonumb a small area of your body. The medicine is injected into the area of nerves that carry feeling to the vagina, vulva, or the area between the vagina and anus (perineum).  General Anesthetic This type of medicine causes you to lose consciousness so you do not feel pain. It is usually used only in emergency situations during labor. It is given through an IV tube or face mask. Paracervical Block A paracervical block is a form of local anesthesia given during labor. Numbing medicine is injected into the right and left sides of  the cervix and vagina. It helps to lessen the pain caused by contractions and stretching of the cervix. It may have to be given more than once.  Pudendal Block A pudendal block is another form of local anesthesia. It is used to relieve the pain associated with pushing or stretching of the perineum at the time of delivery. An injection is given deep through the vaginal wall into the pudendal nerve in the pelvis, numbing the perineum.  Epidural Anesthetic An epidural is an injection of numbing medicine given in the lower back and into the epidural space near your spinal cord. The epidural numbs the lower half of your body. You may be able to move your legs but will not be allowed to walk. Epidurals can be used for labor, delivery, or cesarean deliveries.  To prevent the medicine from wearing off, a small tube (catheter) may be threaded into the epidural space and taped in place to prevent it from slipping out. Medicine can then be given continuously in small doses through the tube until you deliver. Spinal Block A spinal block is similar to an epidural, but the medicine is injected into the spinal fluid, not the epidural space. A spinal block is only given once. It starts to relieve pain quickly but lasts only 1-2 hours. Spinal blocks can also be used for cesarean deliveries.  Combined Spinal-Epidural Block Combined spinal-epidural blocks combine the benefits of both the spinal and epidural blocks. The spinal part acts quickly to relieve pain and the epidural provides continuous pain relief. Hydrotherapy Immersion in warm water during labor may provide comfort and relaxation. It may also help to lessen pain, the use of anesthesia, and the length of labor. However, immersion in water during the delivery (water birth) may have some risk involved and studies to determine safety and risks are ongoing. If you are a healthy woman who is expecting an uncomplicated birth, talk with your health care provider to see  if water birth is an option for you.    This information is not intended to replace advice given to you by your health care provider. Make sure you discuss any questions you have with your health care provider.   Document Released: 11/21/2008 Document Revised: 08/10/2013 Document Reviewed: 12/24/2012 Elsevier Interactive Patient Education 2016 [employer]. Pregnancy, The Father's Role A father has an important role during his partner's pregnancy, labor, delivery, and after the birth of the baby. It is important to help and support your partner through this new period. There are many physical and emotional changes that happen. To be helpful and supportive during this time, you should know and understand what is happening to your partner during pregnancy, labor, delivery, and after the baby is born.  WHAT ARE THE STAGES OF PREGNANCY? Pregnancy usually lasts about 40 weeks. The pregnancy is divided into three trimesters. First Trimester During the first 13 weeks, your partner may:  Feel tired.  Have painful breasts.  Feel nauseous or throw up.  Urinate more often.  Have mood changes. All of these changes are normal. If they are happening, try to be helpful, supportive, and understanding. This may include helping with household duties and activities and spending more time with each other. Second Trimester During the next 14-28 weeks:  Your partner will likely feel better and more energetic.  This is the best time of the pregnancy to be more active together.  You will be able to see her belly showing the pregnancy.  You may be able to feel the baby kick.  Your partner may have soreness or aching in her back as she gains weight. You can help her by carrying heavy things and by rubbing her back when she is feeling sore. Third Trimester During the final 12 weeks, your partner may:  Become more uncomfortable as the baby grows.  Have a hard time doing everyday activities, and her  balance may be off.  Have a hard time bending over.  Tire easily.  Have difficulty sleeping. At this time, the birth of your baby is close. You and your partner may have concerns or questions. This is normal. Talk with each other and with your health care provider. Continue to help your partner with housework, encourage her to rest, and rub her sore back and legs, if this helps her. WHAT CAN I EXPECT OR DO DURING THE PREGNANCY? You can expect to experience some changes. There are also many things you can do to help prepare you and your partner for your baby. Emotional Changes During your partner's pregnancy, emotional changes for you may include:  Having feelings of happiness, excitement, and pride.  Being concerned about having new responsibilities, such as financial or educational responsibilities.  Feeling overwhelmed or scared.  Being worried that a baby will change your relationship with your partner. These feelings are normal. Talk about them openly with your partner and your health care provider. Prenatal Care Attend prenatal care visits with your partner. This is a good time for you to get to know your health care provider, follow the pregnancy, and ask questions.  Prenatal visits usually occur one time each month for six months, then every two weeks for two months, and then one time each week during the last month. You may have more prenatal visits if your health care provider believes this is needed.  Your health care provider usually does an ultrasound of the baby at one of the prenatal visits. This may happen more often if your health care provider thinks it is needed. Sexual Activity Sexual intercourse is safe unless there is a problem with the pregnancy and your health care provider advises you to not have sexual intercourse. Because physical and emotional changes happen in pregnancy, your partner may not want to have sex during certain times. Trying different positions may  make sexual intercourse more comfortable. However, always respect your partner's decision if she does not want to have sex. It is important for both of you to discuss your feelings and desires. Talk with your health care provider about any questions that you may have about sexual intercourse during pregnancy. Childbirth Classes Attend childbirth classes with your partner if you are able. Classes prepare you and help you to understand what happens during  labor and delivery, and they help you and your partner to bond. There are even some classes that are only for new fathers. Classes also teach you and your partner:  Various relaxation techniques.  How to work with her labor pains.  How to focus during labor and delivery. WHAT SHOULD I KNOW ABOUT LABOR AND DELIVERY? Many fathers want to be present while their partner is going through labor and delivery. You may:  Be asked to time the contractions, massage your partner's back, and breathe with her during the contractions.  Get to see and enjoy the excitement of your baby being born, and you may even be able to cut your baby's umbilical cord. If you feel like you might faint or you are uncomfortable, ask someone to help you.  Need to leave the room if a problem develops during labor or delivery. A cesarean delivery, or C-section, is a procedure that may be used to deliver the baby. It is done through an incision in the abdomen and the uterus. A cesarean delivery may be scheduled or it may be an emergency procedure during labor and delivery. Most hospitals allow the father to be in the room for a cesarean delivery unless it is an emergency. Recovery from a cesarean delivery usually requires more help from the father. WHAT HAPPENS AFTER DELIVERY? After your baby is born, your partner will go through many changes again. These changes could last a few months or longer. Postpartum Depression Your partner may take awhile to regain her strength. She may  also have feelings of sadness (postpartum blues or postpartum depression). If your partner is acting unusually sad or depressed, talk with your health care provider right away. This can be a serious medical condition that requires treatment. Breastfeeding Your partner may decide to breastfeed the baby. This helps with bonding between the mother and the baby, and breast milk is the best nutrition for your baby. You can feel included by burping the baby and bottle-feeding the baby with breast milk that was collected from the mother. This allows your partner to rest and helps you to bond with your baby. Sexual Activity It may take a few months for your partner's body to heal and be ready for sexual intercourse again. This may take longer after a cesarean delivery. If you have any questions about having sexual intercourse or if it is painful for your partner, talk with your health care provider. It is possible for breastfeeding mothers to become pregnant even if they are not having menstrual periods. Use birth control (contraception) unless you and your partner would like to become pregnant again. WHAT SHOULD I REMEMBER? Fatherhood and having a baby is an ongoing learning experience. It is common to be anxious, concerned, or afraid that you may not be taking care of your newborn baby properly. It is important to talk with your partner and your health care provider if you are worried or have any questions.   This information is not intended to replace advice given to you by your health care provider. Make sure you discuss any questions you have with your health care provider.   Document Released: 01/22/2008 Document Revised: 08/26/2014 Document Reviewed: 04/22/2014 Elsevier Interactive Patient Education 2016 [place]???  You must attend a Doren Custard class at [facility]  3rd Wednesday of every month from [employer] by calling [phone] or online at  VFederal.at  Bring [place] the certificate from the class  Doren Custard supplies needed for  [facility] patients:  Our practice has a Heritage manager in a Box tub at the hospital that you can borrow  You will need to purchase an accessory kit that has all needed supplies through [facility] [phone]) or online $175.00  Or you can purchase the supplies separately: o Single-use disposable tub liner for Birth Pool in a Box (REGULAR size) o New garden hose labeled "lead-free", "suitable for drinking water", o Electric drain pump to remove water (We recommend 792 gallon per hour or greater pump.)  o  "non-toxic" OR "water potable" o Garden hose to remove the dirty water o Fish net o Bathing suit top (optional) o Long-handled mirror (optional)  [employer] sells tubs for ~ $120 if you would rather purchase your own tub.  They also sell accessories, liners.    Www.waterbirthsolutions.com for tub purchases and supplies  The Labor Ladies (www.thelaborladies.com) $275 for tub rental/set-up & take down/kit   [employer] information regarding doulas (labor support) who provide pool rentals:  IdentityList.se.htm   The Labor Ladies (www.thelaborladies.com)  IdentityList.se.htm   Things that would prevent you from having a waterbirth:  Premature, <37wks  Previous cesarean birth  Presence of thick meconium-stained fluid  Multiple gestation (Twins, triplets, etc.)  Uncontrolled diabetes or gestational diabetes requiring medication  Hypertension  Heavy vaginal bleeding  Non-reassuring fetal heart rate  Active infection (MRSA, etc.)  If your labor has to be induced and induction method requires continuous monitoring of the baby's heart rate  Other risks/issues identified by your obstetrical provider

## 2016-04-04 NOTE — Progress Notes (Signed)
Patient is concerned about her pain L upper back.

## 2016-04-05 LAB — CREATININE, SERUM
CREATININE: 0.63 mg/dL (ref 0.57–1.00)
GFR, EST AFRICAN AMERICAN: 143 mL/min/{1.73_m2} (ref >59)
GFR, EST NON AFRICAN AMERICAN: 124 mL/min/{1.73_m2} (ref >59)

## 2016-04-05 LAB — CBC
HEMOGLOBIN: 12.5 g/dL (ref 11.1–15.9)
Hematocrit: 38.3 % (ref 34.0–46.6)
MCH: 27.8 pg (ref 26.6–33.0)
MCHC: 32.6 g/dL (ref 31.5–35.7)
MCV: 85 fL (ref 79–97)
PLATELETS: 247 10*3/uL (ref 150–379)
RBC: 4.5 x10E6/uL (ref 3.77–5.28)
RDW: 13.8 % (ref 12.3–15.4)
WBC: 7.6 10*3/uL (ref 3.4–10.8)

## 2016-04-05 LAB — PATHOLOGIST SMEAR REVIEW
PATH REV PLTS: NORMAL
PATH REV RBC: NORMAL
Path Rev WBC: NORMAL

## 2016-04-05 LAB — ALT: ALT: 16 IU/L (ref 0–32)

## 2016-04-05 LAB — PROTEIN / CREATININE RATIO, URINE
Creatinine, Urine: 91.8 mg/dL
PROTEIN/CREAT RATIO: 133 mg/g{creat} (ref 0–200)
Protein, Ur: 12.2 mg/dL

## 2016-04-05 LAB — LACTATE DEHYDROGENASE: LDH: 151 IU/L (ref 119–226)

## 2016-04-05 LAB — AST: AST: 22 IU/L (ref 0–40)

## 2016-04-12 ENCOUNTER — Ambulatory Visit (HOSPITAL_COMMUNITY)
Admission: RE | Admit: 2016-04-12 | Discharge: 2016-04-12 | Disposition: A | Discharge status: Home / Self Care | Payer: Medicaid Other | Source: Ambulatory Visit | Attending: Certified Nurse Midwife | Admitting: Certified Nurse Midwife

## 2016-04-12 ENCOUNTER — Encounter (HOSPITAL_COMMUNITY): Payer: Self-pay

## 2016-04-12 ENCOUNTER — Other Ambulatory Visit: Payer: Self-pay | Admitting: Certified Nurse Midwife

## 2016-04-12 DIAGNOSIS — O132 Gestational [pregnancy-induced] hypertension without significant proteinuria, second trimester: Secondary | ICD-10-CM | POA: Diagnosis not present

## 2016-04-12 DIAGNOSIS — O0992 Supervision of high risk pregnancy, unspecified, second trimester: Secondary | ICD-10-CM

## 2016-04-12 DIAGNOSIS — O162 Unspecified maternal hypertension, second trimester: Secondary | ICD-10-CM

## 2016-04-12 DIAGNOSIS — Z3A26 26 weeks gestation of pregnancy: Secondary | ICD-10-CM | POA: Diagnosis not present

## 2016-04-12 NOTE — Progress Notes (Signed)
MATERNAL FETAL MEDICINE CONSULT  Patient Name: Alicia Gay Medical Record Number:  [id number] Date of Birth: 07/31/1991 Requesting Physician Name:  Roe Coombsachelle A Denney, CNM Date of Service: 04/12/2016  Chief Complaint Gestational hypertension  History of Present Illness Alicia Gay was seen today secondary to gestational hypertension at the request of Roe Coombsachelle A Denney, CNM.  The patient is a 25 y.o. G1P0,at 6749w4d with an EDD of 07/15/2016, by Ultrasound dating method.  Alicia Gay was noted to have mildly elevated blood pressures over the course of her last several clinic visits.  She believes that is due to white coat hypertension, as she has always had normal blood pressures and when she checks her blood pressure at a pharmacy it is always normal.  She has no headaches, visual changes, RUQ pain.  She was prescribed procardia and a baby aspirin, but has not started taking them as she does not feel she really has hypertension.  Review of Systems Pertinent items are noted in HPI.  Patient History Obstetrical History G1--Current  Past Medical and Surgical History The patient has no history of chronic medical diseases or prior surgeries.  Family History The patient and the father of the baby have no family history of mental retardation, birth defects, or genetic diseases.  Social History The patient does not use tobacco, alcohol, or other illicit drugs.  Physical Examination Vitals - Pulse 70 BP 131/90, Weight 203.6 General appearance - alert, well appearing, and in no distress Mental status - alert, oriented to person, place, and time Extremities - no pedal edema noted  Assessment and Recommendations Gestational Hypertension.  I discussed the range of complications that are associated with gestational hypertension including, but not limited to, fetal growth disturbance, preterm delivery, preeclampsia, and IUFD with AliciaStangl.  She thinks she has white coat rather than true  hypertension.  I recommended she buy a home BP cuff and bring it into her primary OBs office to determine its accuracy.  If accurate she should check her blood pressure at home a few times per day for a few weeks to determine if in fact she does have true hypertension.  A CBC, AST, ALT, 24 hour urine collection, and serum creatinine have been performed and are normal.  This should be repeated each trimester and as clinically indicated based on disease symptoms or worsening of hypertension.  As gestaitonal hypertension is associated with fetal growth restriction Alicia Gay should have serial growth scans every 4-6 weeks and have twice weekly fetal surveillance should be started at 32 weeks of gestation.  Current guidelines do not recommend using anti-hypertensive medication in women with mild gestational hypertension such as Alicia Gay.  However, I do recommend she start the baby aspirin.  If her BP becomes persistently elevated above 160/110, even in the absence of proteinuria, she should be hospitalized and treated with IV and oral anti-hypertensive medication if less than 34 week or be delivered if greater than 34 weeks.  I spent 30 minutes with Alicia Gay today of which 50% was face-to-face counseling.  [facility].  Please do not hesitate to contact us with questions.   Rema FendtNITSCHE,Jeimy Bickert, MD

## 2016-04-16 NOTE — Addendum Note (Signed)
Addended by: Burnell BlanksMASHBURN, Aldyn Toon on: 04/16/2016 02:40 PM   Modules accepted: Orders

## 2016-04-17 LAB — CREATININE CLEARANCE, URINE, 24 HOUR
CREAT CLEAR: 121 mL/min (ref 88–128)
CREATININE, UR: 56.2 mg/dL
Creatinine, 24H Ur: 1152 mg/24 hr (ref 800–1800)
Creatinine, Ser: 0.66 mg/dL (ref 0.57–1.00)
GFR calc non Af Amer: 122 mL/min/{1.73_m2} (ref >59)
GFR, EST AFRICAN AMERICAN: 141 mL/min/{1.73_m2} (ref >59)

## 2016-04-17 LAB — PROTEIN, URINE, 24 HOUR
PROTEIN 24H UR: 125 mg/(24.h) (ref 30–150)
PROTEIN UR: 6.1 mg/dL

## 2016-04-18 ENCOUNTER — Ambulatory Visit (INDEPENDENT_AMBULATORY_CARE_PROVIDER_SITE_OTHER): Payer: BLUE CROSS/BLUE SHIELD | Admitting: Obstetrics and Gynecology

## 2016-04-18 ENCOUNTER — Other Ambulatory Visit: Payer: BLUE CROSS/BLUE SHIELD

## 2016-04-18 VITALS — BP 132/93 | HR 93 | Wt 202.0 lb

## 2016-04-18 DIAGNOSIS — O132 Gestational [pregnancy-induced] hypertension without significant proteinuria, second trimester: Secondary | ICD-10-CM

## 2016-04-18 DIAGNOSIS — Z3402 Encounter for supervision of normal first pregnancy, second trimester: Secondary | ICD-10-CM

## 2016-04-18 DIAGNOSIS — O162 Unspecified maternal hypertension, second trimester: Secondary | ICD-10-CM

## 2016-04-18 LAB — POCT URINALYSIS DIPSTICK
BILIRUBIN UA: NEGATIVE
Glucose, UA: NEGATIVE
KETONES UA: NEGATIVE
Leukocytes, UA: NEGATIVE
Nitrite, UA: NEGATIVE
PH UA: 7
PROTEIN UA: NEGATIVE
RBC UA: NEGATIVE
Urobilinogen, UA: NEGATIVE

## 2016-04-18 NOTE — Progress Notes (Signed)
   PRENATAL VISIT NOTE  Subjective:  Alicia Gay is a 25 y.o. G1P0 at 4578w3d being seen today for ongoing prenatal care.  She is currently monitored for the following issues for this high-risk pregnancy and has Supervision of normal first pregnancy in second trimester and Elevated blood pressure affecting pregnancy in second trimester, antepartum on her problem list.  Patient reports backache.  Contractions: Not present.  .  Movement: Present. Denies leaking of fluid.   The following portions of the patient's history were reviewed and updated as appropriate: allergies, current medications, past family history, past medical history, past social history, past surgical history and problem list. Problem list updated.  Objective:   Vitals:   04/18/16 0905  BP: (!) 132/93  Pulse: 93  Weight: 202 lb (91.6 kg)    Fetal Status: Fetal Heart Rate (bpm): 150 Fundal Height: 28 cm Movement: Present     General:  Alert, oriented and cooperative. Patient is in no acute distress.  Skin: Skin is warm and dry. No rash noted.   Cardiovascular: Normal heart rate noted  Respiratory: Normal respiratory effort, no problems with respiration noted  Abdomen: Soft, gravid, appropriate for gestational age. Pain/Pressure: Absent     Pelvic:  Cervical exam deferred        Extremities: Normal range of motion.     Mental Status: Normal mood and affect. Normal behavior. Normal judgment and thought content.   Urinalysis:      Assessment and Plan:  Pregnancy: G1P0 at 5278w3d  1. Supervision of normal first pregnancy in second trimester Patient is doing well without complaints. Reassurance provided regarding back pain. Discussed belly band and stretching exercises Third trimester labs today - patient declined tdap due to fear of needles. May decide to have it later - Glucose Tolerance, 2 Hours w/1 Hour - RPR - CBC - HIV antibody  2. Elevated blood pressure affecting pregnancy in second trimester,  antepartum Likely CHTN due to elevated BP during ED visit Patient is without complaints and has been taking daily ASA Continue close monitoring  Preterm labor symptoms and general obstetric precautions including but not limited to vaginal bleeding, contractions, leaking of fluid and fetal movement were reviewed in detail with the patient. Please refer to After Visit Summary for other counseling recommendations.  Return in about 2 weeks (around 05/02/2016).  Catalina AntiguaPeggy Dillion Stowers, MD

## 2016-04-18 NOTE — Addendum Note (Signed)
Addended by: Marya LandryFOSTER, Katty Fretwell D on: 04/18/2016 11:30 AM   Modules accepted: Orders

## 2016-04-18 NOTE — Patient Instructions (Signed)

## 2016-04-19 LAB — GLUCOSE TOLERANCE, 2 HOURS W/ 1HR
GLUCOSE, 1 HOUR: 106 mg/dL (ref 65–179)
GLUCOSE, 2 HOUR: 73 mg/dL (ref 65–152)
Glucose, Fasting: 97 mg/dL — ABNORMAL HIGH (ref 65–91)

## 2016-04-19 LAB — HIV ANTIBODY (ROUTINE TESTING W REFLEX): HIV SCREEN 4TH GENERATION: NONREACTIVE

## 2016-04-19 LAB — CBC
HEMOGLOBIN: 11.9 g/dL (ref 11.1–15.9)
Hematocrit: 37 % (ref 34.0–46.6)
MCH: 26.8 pg (ref 26.6–33.0)
MCHC: 32.2 g/dL (ref 31.5–35.7)
MCV: 83 fL (ref 79–97)
PLATELETS: 236 10*3/uL (ref 150–379)
RBC: 4.44 x10E6/uL (ref 3.77–5.28)
RDW: 13.7 % (ref 12.3–15.4)
WBC: 6.3 10*3/uL (ref 3.4–10.8)

## 2016-04-19 LAB — RPR: RPR: NONREACTIVE

## 2016-05-02 ENCOUNTER — Encounter: Payer: BLUE CROSS/BLUE SHIELD | Admitting: Obstetrics

## 2016-05-02 ENCOUNTER — Ambulatory Visit (INDEPENDENT_AMBULATORY_CARE_PROVIDER_SITE_OTHER): Payer: Medicaid Other | Admitting: Certified Nurse Midwife

## 2016-05-02 DIAGNOSIS — Z3402 Encounter for supervision of normal first pregnancy, second trimester: Secondary | ICD-10-CM

## 2016-05-02 MED ORDER — PRENATE MINI 29-0.6-0.4-350 MG PO CAPS: 1.0000 | ORAL_CAPSULE | Freq: Every day | ORAL | 3 refills | Status: AC

## 2016-05-02 NOTE — Progress Notes (Signed)
Patient reports she is doing well 

## 2016-05-08 ENCOUNTER — Encounter (HOSPITAL_COMMUNITY): Payer: Self-pay

## 2016-05-08 ENCOUNTER — Ambulatory Visit (HOSPITAL_COMMUNITY)
Admission: RE | Admit: 2016-05-08 | Discharge: 2016-05-08 | Disposition: A | Discharge status: Home / Self Care | Payer: Medicaid Other | Source: Ambulatory Visit | Attending: Certified Nurse Midwife | Admitting: Certified Nurse Midwife

## 2016-05-08 DIAGNOSIS — Z36 Encounter for antenatal screening of mother: Secondary | ICD-10-CM | POA: Insufficient documentation

## 2016-05-08 DIAGNOSIS — O10013 Pre-existing essential hypertension complicating pregnancy, third trimester: Secondary | ICD-10-CM | POA: Insufficient documentation

## 2016-05-08 DIAGNOSIS — Z3A30 30 weeks gestation of pregnancy: Secondary | ICD-10-CM | POA: Insufficient documentation

## 2016-05-08 DIAGNOSIS — O0992 Supervision of high risk pregnancy, unspecified, second trimester: Secondary | ICD-10-CM

## 2016-05-08 NOTE — ED Notes (Signed)
Pt states that she gets very nervous when coming for a doctor appointment.  Reports checking her BP at home and it is always normal.  Not taking bp medication.

## 2016-05-09 ENCOUNTER — Other Ambulatory Visit (HOSPITAL_COMMUNITY): Payer: Self-pay | Admitting: *Deleted

## 2016-05-09 DIAGNOSIS — O10913 Unspecified pre-existing hypertension complicating pregnancy, third trimester: Secondary | ICD-10-CM

## 2016-05-17 ENCOUNTER — Encounter: Payer: Self-pay | Admitting: Obstetrics

## 2016-05-17 ENCOUNTER — Ambulatory Visit (INDEPENDENT_AMBULATORY_CARE_PROVIDER_SITE_OTHER): Payer: Medicaid Other | Admitting: Obstetrics

## 2016-05-17 DIAGNOSIS — Z3493 Encounter for supervision of normal pregnancy, unspecified, third trimester: Secondary | ICD-10-CM

## 2016-05-17 DIAGNOSIS — O163 Unspecified maternal hypertension, third trimester: Secondary | ICD-10-CM | POA: Diagnosis not present

## 2016-05-17 NOTE — Progress Notes (Signed)
Subjective:    Alicia Gay is a 25 y.o. female being seen today for her obstetrical visit. She is at 543w4d gestation. Patient reports no complaints. Fetal movement: normal.  Problem List Items Addressed This Visit    None    Visit Diagnoses   None.    Patient Active Problem List   Diagnosis Date Noted  . Elevated blood pressure affecting pregnancy in second trimester, antepartum 04/04/2016  . Supervision of normal first pregnancy in second trimester 02/02/2016   Objective:    BP 128/87   Pulse 71   Wt 207 lb (93.9 kg)   LMP 09/28/2015 (LMP Unknown)   BMI 31.94 kg/m  FHT:  150 BPM  Uterine Size: size equals dates  Presentation: unsure     Assessment:    Pregnancy @ 823w4d weeks   Plan:     labs reviewed, problem list updated Consent signed. GBS sent TDAP offered  Rhogam given for RH negative Pediatrician: discussed. Infant feeding: plans to breastfeed. Maternity leave: discussed. Cigarette smoking: never smoked. No orders of the defined types were placed in this encounter.  No orders of the defined types were placed in this encounter.  Follow up in 2 Weeks.   Patient ID: Alicia Gay, female   DOB: 11/17/1990, 25 y.o.   MRN: [id number]

## 2016-06-03 ENCOUNTER — Ambulatory Visit (HOSPITAL_COMMUNITY)
Admission: RE | Admit: 2016-06-03 | Discharge: 2016-06-03 | Disposition: A | Discharge status: Home / Self Care | Payer: Medicaid Other | Source: Ambulatory Visit | Attending: Certified Nurse Midwife | Admitting: Certified Nurse Midwife

## 2016-06-03 ENCOUNTER — Encounter (HOSPITAL_COMMUNITY): Payer: Self-pay

## 2016-06-03 DIAGNOSIS — O10013 Pre-existing essential hypertension complicating pregnancy, third trimester: Secondary | ICD-10-CM | POA: Diagnosis present

## 2016-06-03 DIAGNOSIS — Z3A34 34 weeks gestation of pregnancy: Secondary | ICD-10-CM | POA: Insufficient documentation

## 2016-06-03 DIAGNOSIS — O10913 Unspecified pre-existing hypertension complicating pregnancy, third trimester: Secondary | ICD-10-CM

## 2016-06-04 ENCOUNTER — Ambulatory Visit (INDEPENDENT_AMBULATORY_CARE_PROVIDER_SITE_OTHER): Payer: Medicaid Other | Admitting: Certified Nurse Midwife

## 2016-06-04 VITALS — BP 136/88 | HR 66 | Wt 208.0 lb

## 2016-06-04 DIAGNOSIS — Z8632 Personal history of gestational diabetes: Secondary | ICD-10-CM

## 2016-06-04 DIAGNOSIS — O09299 Supervision of pregnancy with other poor reproductive or obstetric history, unspecified trimester: Secondary | ICD-10-CM | POA: Insufficient documentation

## 2016-06-04 DIAGNOSIS — O24410 Gestational diabetes mellitus in pregnancy, diet controlled: Secondary | ICD-10-CM | POA: Diagnosis not present

## 2016-06-04 DIAGNOSIS — O0993 Supervision of high risk pregnancy, unspecified, third trimester: Secondary | ICD-10-CM

## 2016-06-04 DIAGNOSIS — Z3402 Encounter for supervision of normal first pregnancy, second trimester: Secondary | ICD-10-CM

## 2016-06-04 MED ORDER — BLOOD GLUCOSE METER KIT: PACK | 5 refills | Status: DC

## 2016-06-04 NOTE — Progress Notes (Signed)
Subjective:    Alicia SagesMiquetta Gay is a 25 y.o. female being seen today for her obstetrical visit. She is at 4282w1d gestation. Patient reports backache, no bleeding, no contractions, no cramping and no leaking. Fetal movement: normal.  Problem List Items Addressed This Visit    None    Visit Diagnoses   None.    Patient Active Problem List   Diagnosis Date Noted  . Elevated blood pressure affecting pregnancy in second trimester, antepartum 04/04/2016  . Supervision of normal first pregnancy in second trimester 02/02/2016   Objective:    BP 136/88   Pulse 66   Wt 208 lb (94.3 kg)   LMP 09/28/2015 (LMP Unknown)   BMI 32.10 kg/m  FHT:  145 BPM  Uterine Size: 35 cm and size equals dates  Presentation: cephalic     Assessment:    Pregnancy @ 4682w1d weeks   CHTN: not taking medications  GDM  Plan:     labs reviewed, problem list updated Consent signed. GBS planning TDAP offered  Rhogam given for RH negative Pediatrician: discussed. Infant feeding: plans to breastfeed. Maternity leave: discussed, is currently working from home. Cigarette smoking: never smoked. No orders of the defined types were placed in this encounter.  No orders of the defined types were placed in this encounter.  Follow up in 1 Week with NST @ cultures.

## 2016-06-11 ENCOUNTER — Encounter: Payer: Medicaid Other | Admitting: Certified Nurse Midwife

## 2016-06-18 ENCOUNTER — Encounter (HOSPITAL_COMMUNITY): Payer: Self-pay

## 2016-06-18 ENCOUNTER — Ambulatory Visit (HOSPITAL_COMMUNITY): Admission: RE | Admit: 2016-06-18 | Payer: Medicaid Other | Source: Ambulatory Visit

## 2016-06-18 HISTORY — DX: Type 2 diabetes mellitus without complications: E11.9

## 2016-06-18 HISTORY — DX: Essential (primary) hypertension: I10

## 2016-06-25 ENCOUNTER — Inpatient Hospital Stay (HOSPITAL_COMMUNITY)
Admission: AD | Admit: 2016-06-25 | Discharge: 2016-06-25 | Disposition: A | Discharge status: Home / Self Care | Payer: Medicaid Other | Source: Ambulatory Visit | Attending: Family Medicine | Admitting: Family Medicine

## 2016-06-25 ENCOUNTER — Inpatient Hospital Stay (HOSPITAL_COMMUNITY): Payer: Medicaid Other

## 2016-06-25 ENCOUNTER — Encounter (HOSPITAL_COMMUNITY): Payer: Self-pay | Admitting: *Deleted

## 2016-06-25 ENCOUNTER — Ambulatory Visit (INDEPENDENT_AMBULATORY_CARE_PROVIDER_SITE_OTHER): Payer: Medicaid Other | Admitting: Certified Nurse Midwife

## 2016-06-25 VITALS — BP 142/94 | HR 69 | Wt 213.0 lb

## 2016-06-25 DIAGNOSIS — Z3689 Encounter for other specified antenatal screening: Secondary | ICD-10-CM

## 2016-06-25 DIAGNOSIS — Z7982 Long term (current) use of aspirin: Secondary | ICD-10-CM | POA: Insufficient documentation

## 2016-06-25 DIAGNOSIS — O0993 Supervision of high risk pregnancy, unspecified, third trimester: Secondary | ICD-10-CM | POA: Diagnosis not present

## 2016-06-25 DIAGNOSIS — Z3A37 37 weeks gestation of pregnancy: Secondary | ICD-10-CM | POA: Insufficient documentation

## 2016-06-25 DIAGNOSIS — O24419 Gestational diabetes mellitus in pregnancy, unspecified control: Secondary | ICD-10-CM | POA: Insufficient documentation

## 2016-06-25 DIAGNOSIS — O288 Other abnormal findings on antenatal screening of mother: Secondary | ICD-10-CM

## 2016-06-25 DIAGNOSIS — O10013 Pre-existing essential hypertension complicating pregnancy, third trimester: Secondary | ICD-10-CM | POA: Insufficient documentation

## 2016-06-25 NOTE — MAU Note (Signed)
Urine sent to lab 

## 2016-06-25 NOTE — MAU Provider Note (Signed)
History     CSN: [id number]  Arrival date and time: 06/25/16 1631   First Provider Initiated Contact with Patient 06/25/16 1710      Chief Complaint  Patient presents with  . nonreactive NST   HPI Alicia Gay is a 25 y.o. G1P0 at 50w1dwho presents from office for non reactive NST. Patient is having NSTs in office d/t GDM & chronic HTN. Was in office today for routine visit, had non reactive NST with variable decel per R Denney's note. Patient denies headache, vision changes, epigastric pain, contractions, or LOF. Positive fetal movement.   OB History    Gravida Para Term Preterm AB Living   1             SAB TAB Ectopic Multiple Live Births                  Past Medical History:  Diagnosis Date  . [place]   . Hypertension     Past Surgical History:  Procedure Laterality Date  . NO PAST SURGERIES      History reviewed. No pertinent family history.  Social History  Substance Use Topics  . Smoking status: Never Smoker  . Smokeless tobacco: Never Used  . Alcohol use No    Allergies: No Known Allergies  Prescriptions Prior to Admission  Medication Sig Dispense Refill Last Dose  . aspirin 81 MG chewable tablet Chew 1 tablet (81 mg total) by mouth daily. (Patient not taking: Reported on 06/04/2016) 30 tablet 5 Not Taking  . blood glucose meter kit and supplies Dispense based on patient and insurance preference. Use up to four times daily as directed. (FOR ICD-9 250.00, 250.01). Patient to check glucose levels fasting am, and 2 hours after meals. 1 each 5   . NIFEdipine (PROCARDIA XL) 30 MG 24 hr tablet Take 1 tablet (30 mg total) by mouth daily. (Patient not taking: Reported on 06/04/2016) 30 tablet 5 Not Taking  . Prenat w/o A-FeCbn-Meth-FA-DHA (PRENATE MINI) 29-0.6-0.4-350 MG CAPS Take 1 capsule by mouth daily before breakfast. 90 capsule 3 Taking    Review of Systems  Constitutional: Negative.   Gastrointestinal: Negative.    Genitourinary: Negative.    Physical Exam   Blood pressure 133/91, pulse 96, temperature 98 F (36.7 C), temperature source Oral, resp. rate 16, last menstrual period 09/28/2015, SpO2 98 %.  Physical Exam  Nursing note and vitals reviewed. Constitutional: She is oriented to person, place, and time. She appears well-developed and well-nourished. No distress.  HENT:  Head: Normocephalic and atraumatic.  Eyes: Conjunctivae are normal. Right eye exhibits no discharge. Left eye exhibits no discharge. No scleral icterus.  Neck: Normal range of motion.  Cardiovascular: Normal rate, regular rhythm and normal heart sounds.   No murmur heard. Respiratory: Effort normal and breath sounds normal. No respiratory distress. She has no wheezes.  GI: Soft. There is no tenderness.  Neurological: She is alert and oriented to person, place, and time.  Skin: Skin is warm and dry. She is not diaphoretic.  Psychiatric: She has a normal mood and affect. Her behavior is normal. Judgment and thought content normal.   Fetal Tracing:  Baseline: 145 Variability: moderate Accelerations: +  Decelerations: -  Toco: none   MAU Course  Procedures No results found for this or any previous visit (from the past 24 hour(s)).  MDM Category 1 tracing No severe range BPs & pt asymptomatic - current BPs in her normal range per review  of records BPP 8/8, AFI 17 Assessment and Plan  A:  1. Non-stress test nonreactive   2. [redacted] weeks gestation of pregnancy    P: Discharge home Fetal kick count Discussed reasons to return to MAU Keep f/u with OB  Jorje Guild 06/25/2016, 5:10 PM

## 2016-06-25 NOTE — Progress Notes (Signed)
Subjective:    Alicia Gay is a 25 y.o. female being seen today for her obstetrical visit. She is at 6774w1d gestation. Patient reports no complaints. Fetal movement: normal.  Problem List Items Addressed This Visit      Other   Supervision of high-risk pregnancy - Primary   Relevant Orders   AMB referral to maternal fetal medicine   US MFM FETAL BPP WO NON STRESS     Patient Active Problem List   Diagnosis Date Noted  . Gestational diabetes 06/04/2016  . Elevated blood pressure affecting pregnancy in second trimester, antepartum 04/04/2016  . Supervision of high-risk pregnancy 02/02/2016    Objective:    BP (!) 142/94   Pulse 69   Wt 213 lb (96.6 kg)   LMP 09/28/2015 (LMP Unknown)   BMI 32.87 kg/m  FHT: 145 BPM  Uterine Size: 37 cm and size equals dates  Presentations: cephalic  Pelvic Exam: deferred   NST: non-reactive: no accels, 1 mild variable, no contractions on toco.    Assessment:    Pregnancy @ 3774w1d weeks   Non-reactive NST  Chronic HTN: no on medication: elevated today!  Plan:    To MAU for evaluation of non-reactive NST  Weekly BPP scheduled Plans for delivery: Vaginal anticipated; labs reviewed; problem list updated Counseling: Consent signed. Infant feeding: plans to breastfeed. Cigarette smoking: never smoked. L&D discussion: symptoms of labor, discussed when to call, discussed what number to call, anesthetic/analgesic options reviewed and delivering clinician:  plans no preference. Postpartum supports and preparation: circumcision discussed and contraception plans discussed.  Follow up in 1 Week with NST.

## 2016-06-25 NOTE — MAU Note (Signed)
Pt was sent over from R. Denny's office for Non-Reactive NST.  Pt denies bleeding, leaking, or pain.

## 2016-06-25 NOTE — Discharge Instructions (Signed)
Fetal Movement Counts °Patient Name: __________________________________________________ Patient Due Date: ____________________ °Performing a fetal movement count is highly recommended in high-risk pregnancies, but it is good for every pregnant woman to do. Your health care provider may ask you to start counting fetal movements at 28 weeks of the pregnancy. Fetal movements often increase: °· After eating a full meal. °· After physical activity. °· After eating or drinking something sweet or cold. °· At rest. °Pay attention to when you feel the baby is most active. This will help you notice a pattern of your baby's sleep and wake cycles and what factors contribute to an increase in fetal movement. It is important to perform a fetal movement count at the same time each day when your baby is normally most active.  °HOW TO COUNT FETAL MOVEMENTS °1. Find a quiet and comfortable area to sit or lie down on your left side. Lying on your left side provides the best blood and oxygen circulation to your baby. °2. Write down the day and time on a sheet of paper or in a journal. °3. Start counting kicks, flutters, swishes, rolls, or jabs in a 2-hour period. You should feel at least 10 movements within 2 hours. °4. If you do not feel 10 movements in 2 hours, wait 2-3 hours and count again. Look for a change in the pattern or not enough counts in 2 hours. °SEEK MEDICAL CARE IF: °· You feel less than 10 counts in 2 hours, tried twice. °· There is no movement in over an hour. °· The pattern is changing or taking longer each day to reach 10 counts in 2 hours. °· You feel the baby is not moving as he or she usually does. °Date: ____________ Movements: ____________ Start time: ____________ Finish time: ____________  °Date: ____________ Movements: ____________ Start time: ____________ Finish time: ____________ °Date: ____________ Movements: ____________ Start time: ____________ Finish time: ____________ °Date: ____________ Movements:  ____________ Start time: ____________ Finish time: ____________ °Date: ____________ Movements: ____________ Start time: ____________ Finish time: ____________ °Date: ____________ Movements: ____________ Start time: ____________ Finish time: ____________ °Date: ____________ Movements: ____________ Start time: ____________ Finish time: ____________ °Date: ____________ Movements: ____________ Start time: ____________ Finish time: ____________  °Date: ____________ Movements: ____________ Start time: ____________ Finish time: ____________ °Date: ____________ Movements: ____________ Start time: ____________ Finish time: ____________ °Date: ____________ Movements: ____________ Start time: ____________ Finish time: ____________ °Date: ____________ Movements: ____________ Start time: ____________ Finish time: ____________ °Date: ____________ Movements: ____________ Start time: ____________ Finish time: ____________ °Date: ____________ Movements: ____________ Start time: ____________ Finish time: ____________ °Date: ____________ Movements: ____________ Start time: ____________ Finish time: ____________  °Date: ____________ Movements: ____________ Start time: ____________ Finish time: ____________ °Date: ____________ Movements: ____________ Start time: ____________ Finish time: ____________ °Date: ____________ Movements: ____________ Start time: ____________ Finish time: ____________ °Date: ____________ Movements: ____________ Start time: ____________ Finish time: ____________ °Date: ____________ Movements: ____________ Start time: ____________ Finish time: ____________ °Date: ____________ Movements: ____________ Start time: ____________ Finish time: ____________ °Date: ____________ Movements: ____________ Start time: ____________ Finish time: ____________  °Date: ____________ Movements: ____________ Start time: ____________ Finish time: ____________ °Date: ____________ Movements: ____________ Start time: ____________ Finish  time: ____________ °Date: ____________ Movements: ____________ Start time: ____________ Finish time: ____________ °Date: ____________ Movements: ____________ Start time: ____________ Finish time: ____________ °Date: ____________ Movements: ____________ Start time: ____________ Finish time: ____________ °Date: ____________ Movements: ____________ Start time: ____________ Finish time: ____________ °Date: ____________ Movements: ____________ Start time: ____________ Finish time: ____________  °Date: ____________ Movements: ____________ Start time: ____________ Finish   time: ____________ Date: ____________ Movements: ____________ Start time: ____________ Doreatha MartinFinish time: ____________ Date: ____________ Movements: ____________ Start time: ____________ Doreatha MartinFinish time: ____________ Date: ____________ Movements: ____________ Start time: ____________ Doreatha MartinFinish time: ____________ Date: ____________ Movements: ____________ Start time: ____________ Doreatha MartinFinish time: ____________ Date: ____________ Movements: ____________ Start time: ____________ Doreatha MartinFinish time: ____________ Date: ____________ Movements: ____________ Start time: ____________ Doreatha MartinFinish time: ____________  Date: ____________ Movements: ____________ Start time: ____________ Doreatha MartinFinish time: ____________ Date: ____________ Movements: ____________ Start time: ____________ Doreatha MartinFinish time: ____________ Date: ____________ Movements: ____________ Start time: ____________ Doreatha MartinFinish time: ____________ Date: ____________ Movements: ____________ Start time: ____________ Doreatha MartinFinish time: ____________ Date: ____________ Movements: ____________ Start time: ____________ Doreatha MartinFinish time: ____________ Date: ____________ Movements: ____________ Start time: ____________ Doreatha MartinFinish time: ____________ Date: ____________ Movements: ____________ Start time: ____________ Doreatha MartinFinish time: ____________  Date: ____________ Movements: ____________ Start time: ____________ Doreatha MartinFinish time: ____________ Date: ____________  Movements: ____________ Start time: ____________ Doreatha MartinFinish time: ____________ Date: ____________ Movements: ____________ Start time: ____________ Doreatha MartinFinish time: ____________ Date: ____________ Movements: ____________ Start time: ____________ Doreatha MartinFinish time: ____________ Date: ____________ Movements: ____________ Start time: ____________ Doreatha MartinFinish time: ____________ Date: ____________ Movements: ____________ Start time: ____________ Doreatha MartinFinish time: ____________ Date: ____________ Movements: ____________ Start time: ____________ Doreatha MartinFinish time: ____________  Date: ____________ Movements: ____________ Start time: ____________ Doreatha MartinFinish time: ____________ Date: ____________ Movements: ____________ Start time: ____________ Doreatha MartinFinish time: ____________ Date: ____________ Movements: ____________ Start time: ____________ Doreatha MartinFinish time: ____________ Date: ____________ Movements: ____________ Start time: ____________ Doreatha MartinFinish time: ____________ Date: ____________ Movements: ____________ Start time: ____________ Doreatha MartinFinish time: ____________ Date: ____________ Movements: ____________ Start time: ____________ Doreatha MartinFinish time: ____________   This information is not intended to replace advice given to you by your health care provider. Make sure you discuss any questions you have with your health care provider.   Document Released: 09/04/2006 Document Revised: 08/26/2014 Document Reviewed: 06/01/2012 Elsevier Interactive Patient Education 2016 [employer]   Hypertension During Pregnancy Hypertension, or high blood pressure, is when there is extra pressure inside your blood vessels that carry blood from the heart to the rest of your body (arteries). It can happen at any time in life, including pregnancy. Hypertension during pregnancy can cause problems for you and your baby. Your baby might not weigh as much as he or she should at birth or might be born early (premature). Very bad cases of hypertension during pregnancy can be life-threatening.   Different types of hypertension can occur during pregnancy. These include:  Chronic hypertension. This happens when a woman has hypertension before pregnancy and it continues during pregnancy.  Gestational hypertension. This is when hypertension develops during pregnancy.  Preeclampsia or toxemia of pregnancy. This is a very serious type of hypertension that develops only during pregnancy. It affects the whole body and can be very dangerous for both mother and baby.  Gestational hypertension and preeclampsia usually go away after your baby is born. Your blood pressure will likely stabilize within 6 weeks. Women who have hypertension during pregnancy have a greater chance of developing hypertension later in life or with future pregnancies. RISK FACTORS There are certain factors that make it more likely for you to develop hypertension during pregnancy. These include:  Having hypertension before pregnancy.  Having hypertension during a previous pregnancy.  Being overweight.  Being older than 40 years.  Being pregnant with more than one baby.  Having diabetes or kidney problems. SIGNS AND SYMPTOMS Chronic and gestational hypertension rarely cause symptoms. Preeclampsia has symptoms, which may include:  Increased protein in your urine. Your health care provider will check for this at  every prenatal visit.  Swelling of your hands and face.  Rapid weight gain.  Headaches.  Visual changes.  Being bothered by light.  Abdominal pain, especially in the upper right area.  Chest pain.  Shortness of breath.  Increased reflexes.  Seizures. These occur with a more severe form of preeclampsia, called eclampsia. DIAGNOSIS  You may be diagnosed with hypertension during a regular prenatal exam. At each prenatal visit, you may have:  Your blood pressure checked.  A urine test to check for protein in your urine. The type of hypertension you are diagnosed with depends on when you  developed it. It also depends on your specific blood pressure reading.  Developing hypertension before 20 weeks of pregnancy is consistent with chronic hypertension.  Developing hypertension after 20 weeks of pregnancy is consistent with gestational hypertension.  Hypertension with increased urinary protein is diagnosed as preeclampsia.  Blood pressure measurements that stay above 160 systolic or 110 diastolic are a sign of severe preeclampsia. TREATMENT Treatment for hypertension during pregnancy varies. Treatment depends on the type of hypertension and how serious it is.  If you take medicine for chronic hypertension, you may need to switch medicines.  Medicines called ACE inhibitors should not be taken during pregnancy.  Low-dose aspirin may be suggested for women who have risk factors for preeclampsia.  If you have gestational hypertension, you may need to take a blood pressure medicine that is safe during pregnancy. Your health care provider will recommend the correct medicine.  If you have severe preeclampsia, you may need to be in the hospital. Health care providers will watch you and your baby very closely. You also may need to take medicine called magnesium sulfate to prevent seizures and lower blood pressure.  Sometimes, an early delivery is needed. This may be the case if the condition worsens. It would be done to protect you and your baby. The only cure for preeclampsia is delivery.  Your health care provider may recommend that you take one low-dose aspirin (81 mg) each day to help prevent high blood pressure during your pregnancy if you are at risk for preeclampsia. You may be at risk for preeclampsia if:  You had preeclampsia or eclampsia during a previous pregnancy.  Your baby did not grow as expected during a previous pregnancy.  You experienced preterm birth with a previous pregnancy.  You experienced a separation of the placenta from the uterus (placental abruption)  during a previous pregnancy.  You experienced the loss of your baby during a previous pregnancy.  You are pregnant with more than one baby.  You have other medical conditions, such as diabetes or an autoimmune disease. HOME CARE INSTRUCTIONS  Schedule and keep all of your regular prenatal care appointments. This is important.  Take medicines only as directed by your health care provider. Tell your health care provider about all medicines you take.  Eat as little salt as possible.  Get regular exercise.  Do not drink alcohol.  Do not use tobacco products.  Do not drink products with caffeine.  Lie on your left side when resting. SEEK IMMEDIATE MEDICAL CARE IF:  You have severe abdominal pain.  You have sudden swelling in your hands, ankles, or face.  You gain 4 pounds (1.8 kg) or more in 1 week.  You vomit repeatedly.  You have vaginal bleeding.  You do not feel your baby moving as much.  You have a headache.  You have blurred or double vision.  You have muscle  twitching or spasms.  You have shortness of breath.  You have blue fingernails or lips.  You have blood in your urine. MAKE SURE YOU:  Understand these instructions.  Will watch your condition.  Will get help right away if you are not doing well or get worse.   This information is not intended to replace advice given to you by your health care provider. Make sure you discuss any questions you have with your health care provider.   Document Released: 04/23/2011 Document Revised: 08/26/2014 Document Reviewed: 03/04/2013 Elsevier Interactive Patient Education [employer] Elsevier Inc.

## 2016-07-01 ENCOUNTER — Ambulatory Visit (INDEPENDENT_AMBULATORY_CARE_PROVIDER_SITE_OTHER): Payer: Self-pay | Admitting: Pediatrics

## 2016-07-01 DIAGNOSIS — Z7681 Expectant parent(s) prebirth pediatrician visit: Secondary | ICD-10-CM

## 2016-07-01 DIAGNOSIS — Z3490 Encounter for supervision of normal pregnancy, unspecified, unspecified trimester: Secondary | ICD-10-CM

## 2016-07-02 ENCOUNTER — Ambulatory Visit (HOSPITAL_COMMUNITY): Payer: Medicaid Other

## 2016-07-02 ENCOUNTER — Telehealth: Payer: Self-pay

## 2016-07-02 ENCOUNTER — Encounter (HOSPITAL_COMMUNITY): Payer: Self-pay

## 2016-07-02 ENCOUNTER — Encounter: Payer: Medicaid Other | Admitting: Certified Nurse Midwife

## 2016-07-02 ENCOUNTER — Other Ambulatory Visit: Payer: Self-pay | Admitting: Certified Nurse Midwife

## 2016-07-02 ENCOUNTER — Ambulatory Visit (HOSPITAL_COMMUNITY)
Admission: RE | Admit: 2016-07-02 | Discharge: 2016-07-02 | Disposition: A | Discharge status: Home / Self Care | Payer: Medicaid Other | Source: Ambulatory Visit | Attending: Certified Nurse Midwife | Admitting: Certified Nurse Midwife

## 2016-07-02 ENCOUNTER — Ambulatory Visit (INDEPENDENT_AMBULATORY_CARE_PROVIDER_SITE_OTHER): Payer: Medicaid Other | Admitting: Obstetrics and Gynecology

## 2016-07-02 VITALS — BP 146/96 | HR 91 | Temp 97.7°F | Wt 213.6 lb

## 2016-07-02 DIAGNOSIS — O0993 Supervision of high risk pregnancy, unspecified, third trimester: Secondary | ICD-10-CM

## 2016-07-02 DIAGNOSIS — O24410 Gestational diabetes mellitus in pregnancy, diet controlled: Secondary | ICD-10-CM

## 2016-07-02 DIAGNOSIS — Z3A38 38 weeks gestation of pregnancy: Secondary | ICD-10-CM | POA: Diagnosis not present

## 2016-07-02 DIAGNOSIS — O10013 Pre-existing essential hypertension complicating pregnancy, third trimester: Secondary | ICD-10-CM | POA: Insufficient documentation

## 2016-07-02 DIAGNOSIS — O133 Gestational [pregnancy-induced] hypertension without significant proteinuria, third trimester: Secondary | ICD-10-CM | POA: Diagnosis not present

## 2016-07-02 DIAGNOSIS — O162 Unspecified maternal hypertension, second trimester: Secondary | ICD-10-CM

## 2016-07-02 NOTE — Progress Notes (Signed)
Subjective:  Alicia Gay is a 25 y.o. G1P0 at 75w1dbeing seen today for ongoing prenatal care.  She is currently monitored for the following issues for this high-risk pregnancy and has Supervision of high-risk pregnancy; Elevated blood pressure affecting pregnancy in second trimester, antepartum; and Gestational diabetes on her problem list.  Patient reports no complaints.  Contractions: Not present. Vag. Bleeding: None.  Movement: Present. Denies leaking of fluid.   The following portions of the patient's history were reviewed and updated as appropriate: allergies, current medications, past family history, past medical history, past social history, past surgical history and problem list. Problem list updated.  Objective:   Vitals:   07/02/16 1108  BP: (!) 146/96  Pulse: 91  Temp: 97.7 F (36.5 C)  Weight: 213 lb 9.6 oz (96.9 kg)    Fetal Status:   Fundal Height: 38 cm Movement: Present     General:  Alert, oriented and cooperative. Patient is in no acute distress.  Skin: Skin is warm and dry. No rash noted.   Cardiovascular: Normal heart rate noted  Respiratory: Normal respiratory effort, no problems with respiration noted  Abdomen: Soft, gravid, appropriate for gestational age. Pain/Pressure: Absent     Pelvic:  Cervical exam deferred        Extremities: Normal range of motion.  Edema: None  Mental Status: Normal mood and affect. Normal behavior. Normal judgment and thought content.   Urinalysis: Urine Protein: Trace Urine Glucose: Negative  Assessment and Plan:  Pregnancy: G1P0 at 369w1d1. Supervision of high risk pregnancy in third trimester   2. Diet controlled gestational diabetes mellitus (GDM) in third trimester Pt has not been checking her BS. - Hemoglobin A1c  3. Elevated blood pressure affecting pregnancy in second trimester, antepartum CHTN vs GHTN BP 115/92 at ER visit at 15 weeks Most OB visit BP dystolic 9011'S Pt thinks her BP is increased because  she is nervous Has been checking BP at pharmacy periodically during pregnancy. Readings 110'2/70's.  Was prescribed Procardia and BASA but not taking.  BPP 8/8 on 06/25/16 Will check NST and U/S plus labs today and proceed forward based on these results. ? Delivery between 39-40 weeks - CBC - Comp Met (CMET) - Protein / creatinine ratio, urine  Term labor symptoms and general obstetric precautions including but not limited to vaginal bleeding, contractions, leaking of fluid and fetal movement were reviewed in detail with the patient. Please refer to After Visit Summary for other counseling recommendations.  No Follow-up on file.   MiChancy MilroyMD

## 2016-07-02 NOTE — Progress Notes (Signed)
NST today not reactive. For BPP and growth U/S today. Order for BP cuff for pt to monitor BP at home. Pt instructed to check BP 2-3 times a day, record, bring readings and BP cuff to next OB appt. 2 hr PP today 110.

## 2016-07-02 NOTE — Telephone Encounter (Signed)
Called pt 2x left message for pt to call office about her scheduled appointments.

## 2016-07-02 NOTE — Progress Notes (Signed)
Patient states that she feels good today, reports good fetal movement. Patient states that she takes BP herself and it is only elevated at appts because she is nervous.

## 2016-07-03 LAB — COMPREHENSIVE METABOLIC PANEL
ALT: 17 IU/L (ref 0–32)
AST: 22 IU/L (ref 0–40)
Albumin/Globulin Ratio: 1.2 (ref 1.2–2.2)
Albumin: 3.6 g/dL (ref 3.5–5.5)
Alkaline Phosphatase: 70 IU/L (ref 39–117)
BUN/Creatinine Ratio: 12 (ref 9–23)
BUN: 7 mg/dL (ref 6–20)
Bilirubin Total: 0.2 mg/dL (ref 0.0–1.2)
CALCIUM: 9.9 mg/dL (ref 8.7–10.2)
CO2: 20 mmol/L (ref 18–29)
CREATININE: 0.59 mg/dL (ref 0.57–1.00)
Chloride: 100 mmol/L (ref 96–106)
GFR, EST AFRICAN AMERICAN: 145 mL/min/{1.73_m2} (ref >59)
GFR, EST NON AFRICAN AMERICAN: 126 mL/min/{1.73_m2} (ref >59)
GLOBULIN, TOTAL: 3.1 g/dL (ref 1.5–4.5)
Glucose: 79 mg/dL (ref 65–99)
Potassium: 4.4 mmol/L (ref 3.5–5.2)
Sodium: 137 mmol/L (ref 134–144)
TOTAL PROTEIN: 6.7 g/dL (ref 6.0–8.5)

## 2016-07-03 LAB — CBC
HEMATOCRIT: 38.3 % (ref 34.0–46.6)
Hemoglobin: 12.9 g/dL (ref 11.1–15.9)
MCH: 27.7 pg (ref 26.6–33.0)
MCHC: 33.7 g/dL (ref 31.5–35.7)
MCV: 82 fL (ref 79–97)
PLATELETS: 237 10*3/uL (ref 150–379)
RBC: 4.66 x10E6/uL (ref 3.77–5.28)
RDW: 14.3 % (ref 12.3–15.4)
WBC: 7.2 10*3/uL (ref 3.4–10.8)

## 2016-07-03 LAB — PROTEIN / CREATININE RATIO, URINE
Creatinine, Urine: 148.7 mg/dL
PROTEIN UR: 18 mg/dL
PROTEIN/CREAT RATIO: 121 mg/g{creat} (ref 0–200)

## 2016-07-03 LAB — HEMOGLOBIN A1C
ESTIMATED AVERAGE GLUCOSE: 114 mg/dL
Hgb A1c MFr Bld: 5.6 % (ref 4.8–5.6)

## 2016-07-03 NOTE — Progress Notes (Signed)
Prenatal counseling for impending newborn done-- 1st ghild due Nov27, prenatal care started around 10 wks per mom.  Z76.81

## 2016-07-04 ENCOUNTER — Other Ambulatory Visit: Payer: Self-pay | Admitting: Certified Nurse Midwife

## 2016-07-09 ENCOUNTER — Ambulatory Visit (INDEPENDENT_AMBULATORY_CARE_PROVIDER_SITE_OTHER): Payer: Medicaid Other | Admitting: Obstetrics & Gynecology

## 2016-07-09 VITALS — BP 145/94 | HR 64 | Wt 216.8 lb

## 2016-07-09 DIAGNOSIS — O133 Gestational [pregnancy-induced] hypertension without significant proteinuria, third trimester: Secondary | ICD-10-CM | POA: Diagnosis not present

## 2016-07-09 DIAGNOSIS — O0993 Supervision of high risk pregnancy, unspecified, third trimester: Secondary | ICD-10-CM

## 2016-07-09 DIAGNOSIS — O24419 Gestational diabetes mellitus in pregnancy, unspecified control: Secondary | ICD-10-CM

## 2016-07-09 NOTE — Progress Notes (Signed)
Patient has been expected for IOL since this morning and has been called repeatedly without any answer.  L&D staff will continue to try to contact patient.  Tereso NewcomerUgonna A Anyanwu, MD

## 2016-07-09 NOTE — Progress Notes (Signed)
   PRENATAL VISIT NOTE  Subjective:  Alicia Gay is a 25 y.o. G1P0 at 6686w1d being seen today for ongoing prenatal care.  She is currently monitored for the following issues for this high-risk pregnancy and has Supervision of high-risk pregnancy; Elevated blood pressure affecting pregnancy in second trimester, antepartum; and Gestational diabetes on her problem list.  Patient reports no complaints.  Contractions: Not present. Vag. Bleeding: None.  Movement: Present. Denies leaking of fluid.   The following portions of the patient's history were reviewed and updated as appropriate: allergies, current medications, past family history, past medical history, past social history, past surgical history and problem list. Problem list updated.  Objective:   Vitals:   07/09/16 1022  BP: (!) 145/94  Pulse: 64  Weight: 216 lb 12.8 oz (98.3 kg)    Fetal Status:     Movement: Present     General:  Alert, oriented and cooperative. Patient is in no acute distress.  Skin: Skin is warm and dry. No rash noted.   Cardiovascular: Normal heart rate noted  Respiratory: Normal respiratory effort, no problems with respiration noted  Abdomen: Soft, gravid, appropriate for gestational age. Pain/Pressure: Absent     Pelvic:  Cervical exam performed        Extremities: Normal range of motion.     Mental Status: Normal mood and affect. Normal behavior. Normal judgment and thought content.   Assessment and Plan:  Pregnancy: G1P0 at 2286w1d  1. Supervision of high risk pregnancy in third trimester Gestational htn  Term labor symptoms and general obstetric precautions including but not limited to vaginal bleeding, contractions, leaking of fluid and fetal movement were reviewed in detail with the patient. Please refer to After Visit Summary for other counseling recommendations.  Return in about 6 weeks (around 08/20/2016). To L&D for IOL for gestational HTN at 8539 weeks  Adam PhenixJames G Veyda Kaufman, MD

## 2016-07-10 ENCOUNTER — Telehealth: Payer: Self-pay | Admitting: *Deleted

## 2016-07-10 ENCOUNTER — Telehealth: Payer: Self-pay | Admitting: Advanced Practice Midwife

## 2016-07-10 ENCOUNTER — Other Ambulatory Visit: Payer: Self-pay | Admitting: Certified Nurse Midwife

## 2016-07-10 NOTE — Telephone Encounter (Signed)
CALLED PATIENT AND LEFT HER A VOICEMAIL LETTING HER KNOW THAT SHE WAS SUPPOSED TO GO TO MAU FOR INDUCTION DUE TO GESTATIONAL HIGH BLOOD PRESSURE AFTER HER OFFICE VISIT ON 07/09/2016. ALSO, CALLED BOTH EMERGENCY CONTACTS AND SPOKE WITH DARRYL PETTIFORD LISTED AS SIGNIFICANT OTHER ON DPR AND LET HIM KNOW THAT IT WAS IMPERATIVE THAT MS. Frerking GO TO THE MAU FOR INDUCTION AND HE STATED HE WOULD CALL HER AND CALL ME BACK HAVENT RECEIVED A CALLBACK AS OF 12:19 PM ON 07/10/2016.Marland Kitchen.Marland Kitchen..Marland Kitchen

## 2016-07-10 NOTE — Telephone Encounter (Signed)
Several [facility] to direct admission today for IOL for GHTN at 39 weeks.  No answer, no returned call.  Called pt emergency contact at 11:40 am and left message for [name] to [facility].

## 2016-07-16 ENCOUNTER — Inpatient Hospital Stay (HOSPITAL_COMMUNITY)
Admission: AD | Admit: 2016-07-16 | Discharge: 2016-07-21 | DRG: 774 | Disposition: A | Discharge status: Home / Self Care | Payer: Medicaid Other | Source: Ambulatory Visit | Attending: Family Medicine | Admitting: Family Medicine

## 2016-07-16 ENCOUNTER — Encounter (HOSPITAL_COMMUNITY): Payer: Self-pay | Admitting: *Deleted

## 2016-07-16 DIAGNOSIS — O114 Pre-existing hypertension with pre-eclampsia, complicating childbirth: Secondary | ICD-10-CM | POA: Diagnosis present

## 2016-07-16 DIAGNOSIS — O1002 Pre-existing essential hypertension complicating childbirth: Secondary | ICD-10-CM | POA: Diagnosis present

## 2016-07-16 DIAGNOSIS — Z3A40 40 weeks gestation of pregnancy: Secondary | ICD-10-CM

## 2016-07-16 DIAGNOSIS — O134 Gestational [pregnancy-induced] hypertension without significant proteinuria, complicating childbirth: Secondary | ICD-10-CM

## 2016-07-16 DIAGNOSIS — I10 Essential (primary) hypertension: Secondary | ICD-10-CM | POA: Diagnosis present

## 2016-07-16 DIAGNOSIS — Z833 Family history of diabetes mellitus: Secondary | ICD-10-CM | POA: Diagnosis not present

## 2016-07-16 DIAGNOSIS — O24420 Gestational diabetes mellitus in childbirth, diet controlled: Secondary | ICD-10-CM

## 2016-07-16 DIAGNOSIS — O99824 Streptococcus B carrier state complicating childbirth: Secondary | ICD-10-CM

## 2016-07-16 HISTORY — DX: Gestational diabetes mellitus in pregnancy, unspecified control: O24.419

## 2016-07-16 LAB — CBC
HEMATOCRIT: 36.6 % (ref 36.0–46.0)
HEMOGLOBIN: 12.6 g/dL (ref 12.0–15.0)
MCH: 27.6 pg (ref 26.0–34.0)
MCHC: 34.4 g/dL (ref 30.0–36.0)
MCV: 80.3 fL (ref 78.0–100.0)
Platelets: 209 10*3/uL (ref 150–400)
RBC: 4.56 MIL/uL (ref 3.87–5.11)
RDW: 13.8 % (ref 11.5–15.5)
WBC: 6.2 10*3/uL (ref 4.0–10.5)

## 2016-07-16 LAB — COMPREHENSIVE METABOLIC PANEL
ALK PHOS: 69 U/L (ref 38–126)
ALT: 26 U/L (ref 14–54)
ANION GAP: 7 (ref 5–15)
AST: 31 U/L (ref 15–41)
Albumin: 3.3 g/dL — ABNORMAL LOW (ref 3.5–5.0)
BILIRUBIN TOTAL: 0.6 mg/dL (ref 0.3–1.2)
BUN: 11 mg/dL (ref 6–20)
CALCIUM: 9.7 mg/dL (ref 8.9–10.3)
CO2: 23 mmol/L (ref 22–32)
CREATININE: 0.63 mg/dL (ref 0.44–1.00)
Chloride: 103 mmol/L (ref 101–111)
GFR calc non Af Amer: >60 mL/min (ref >60)
GLUCOSE: 75 mg/dL (ref 65–99)
Potassium: 4.1 mmol/L (ref 3.5–5.1)
SODIUM: 133 mmol/L — AB (ref 135–145)
TOTAL PROTEIN: 6.4 g/dL — AB (ref 6.5–8.1)

## 2016-07-16 LAB — GROUP B STREP BY PCR: Group B strep by PCR: POSITIVE — AB

## 2016-07-16 LAB — PROTEIN / CREATININE RATIO, URINE: Creatinine, Urine: 74 mg/dL

## 2016-07-16 LAB — TYPE AND SCREEN
ABO/RH(D): O POS
Antibody Screen: NEGATIVE

## 2016-07-16 LAB — ABO/RH: ABO/RH(D): O POS

## 2016-07-16 LAB — OB RESULTS CONSOLE GBS: GBS: POSITIVE

## 2016-07-16 MED ORDER — PENICILLIN G POT IN DEXTROSE 60000 UNIT/ML IV SOLN
3.0000 10*6.[IU] | INTRAVENOUS | Status: DC
  Filled 2016-07-16 (×11): qty 50

## 2016-07-16 MED ORDER — LACTATED RINGERS IV SOLN
INTRAVENOUS | Status: DC
  Administered 2016-07-16 – 2016-07-18 (×4): via INTRAVENOUS
  Administered 2016-07-19: 100 mL/h via INTRAVENOUS

## 2016-07-16 MED ORDER — SOD CITRATE-CITRIC ACID 500-334 MG/5ML PO SOLN: 30.0000 mL | ORAL | Status: DC | PRN

## 2016-07-16 MED ORDER — FENTANYL CITRATE (PF) 100 MCG/2ML IJ SOLN
50.0000 ug | INTRAMUSCULAR | Status: DC | PRN
  Administered 2016-07-19 (×2): 100 ug via INTRAVENOUS
  Filled 2016-07-16 (×3): qty 2

## 2016-07-16 MED ORDER — OXYCODONE-ACETAMINOPHEN 5-325 MG PO TABS: 1.0000 | ORAL_TABLET | ORAL | Status: DC | PRN

## 2016-07-16 MED ORDER — OXYTOCIN 40 UNITS IN LACTATED RINGERS INFUSION - SIMPLE MED: 2.5000 [IU]/h | INTRAVENOUS | Status: DC

## 2016-07-16 MED ORDER — ACETAMINOPHEN 325 MG PO TABS
650.0000 mg | ORAL_TABLET | ORAL | Status: DC | PRN
  Administered 2016-07-18 (×2): 650 mg via ORAL
  Filled 2016-07-16 (×2): qty 2

## 2016-07-16 MED ORDER — LIDOCAINE HCL (PF) 1 % IJ SOLN
30.0000 mL | INTRAMUSCULAR | Status: DC | PRN
  Filled 2016-07-16: qty 30

## 2016-07-16 MED ORDER — ONDANSETRON HCL 4 MG/2ML IJ SOLN
4.0000 mg | Freq: Four times a day (QID) | INTRAMUSCULAR | Status: DC | PRN
  Administered 2016-07-18 – 2016-07-19 (×2): 4 mg via INTRAVENOUS
  Filled 2016-07-16 (×2): qty 2

## 2016-07-16 MED ORDER — OXYCODONE-ACETAMINOPHEN 5-325 MG PO TABS: 2.0000 | ORAL_TABLET | ORAL | Status: DC | PRN

## 2016-07-16 MED ORDER — TERBUTALINE SULFATE 1 MG/ML IJ SOLN: 0.2500 mg | Freq: Once | INTRAMUSCULAR | Status: DC | PRN

## 2016-07-16 MED ORDER — PENICILLIN G POTASSIUM 5000000 UNITS IJ SOLR
5.0000 10*6.[IU] | Freq: Once | INTRAVENOUS | Status: DC
  Filled 2016-07-16: qty 5

## 2016-07-16 MED ORDER — LACTATED RINGERS IV SOLN: 500.0000 mL | INTRAVENOUS | Status: DC | PRN

## 2016-07-16 MED ORDER — MISOPROSTOL 25 MCG QUARTER TABLET
25.0000 ug | ORAL_TABLET | ORAL | Status: DC | PRN
  Administered 2016-07-16 – 2016-07-17 (×6): 25 ug via VAGINAL
  Filled 2016-07-16 (×7): qty 0.25

## 2016-07-16 MED ORDER — OXYTOCIN BOLUS FROM INFUSION
500.0000 mL | Freq: Once | INTRAVENOUS | Status: AC
Start: 2016-07-16 — End: 2016-07-19
  Administered 2016-07-19: 500 mL/h via INTRAVENOUS

## 2016-07-16 NOTE — MAU Note (Signed)
Noted gest hypertension, pt states BP is always up in office, no symptoms, will leave and get checked elsewhere and is always normal.  Gest diab noted on problem list: when reviewing labs fasting was 97, 1 hr and 2hr GTT were normal

## 2016-07-16 NOTE — Anesthesia Pain Management Evaluation Note (Signed)
  CRNA Pain Management Visit Note  Patient: Alicia Gay, 25 y.o., female  "Hello [facility] am a [facility]. We have an anesthesia team available at all times to provide care throughout the hospital, including epidural management and anesthesia for C-section. I don't know your plan for the delivery whether it a natural birth, water birth, IV sedation, nitrous supplementation, doula or epidural, but we want to meet your pain goals."   1.Was your pain managed to your expectations on prior hospitalizations?   No prior hospitalizations  2.What is your expectation for pain management during this hospitalization?     Labor support without medications  3.How can we help you reach that goal?   Record the patient's initial score and the patient's pain goal.   Pain: 0  Pain Goal: 8 [facility] wants you to be able to say your pain was always managed very well.  Alicia Gay,Alicia Gay 07/16/2016

## 2016-07-16 NOTE — H&P (Signed)
LABOR AND DELIVERY ADMISSION HISTORY AND PHYSICAL NOTE  Alicia Gay is a 25 y.o. female G58P0 with IUP at 94w1dby 19wk [place] I[place] for chronic HTN.   She reports positive fetal movement. She denies leakage of fluid or vaginal bleeding.  Prenatal History/Complications:  Past Medical History: Past Medical History:  Diagnosis Date  . Diabetes mellitus without complication ([place]   . Gestational diabetes   . Hypertension     Past Surgical History: Past Surgical History:  Procedure Laterality Date  . NO PAST SURGERIES      Obstetrical History: OB History    Gravida Para Term Preterm AB Living   1         0   SAB TAB Ectopic Multiple Live Births                  Social History: Social History   Social History  . Marital status: Single    Spouse name: N/A  . Number of children: N/A  . Years of education: N/A   Social History Main Topics  . Smoking status: Never Smoker  . Smokeless tobacco: Never Used  . Alcohol use No     Comment: prior to preg  . Drug use: No  . Sexual activity: Not Currently    Birth control/ protection: None   Other Topics Concern  . None   Social History Narrative  . None    Family History: Family History  Problem Relation Age of Onset  . Cancer Maternal Aunt   . Diabetes Paternal Grandmother     Allergies: No Known Allergies  Prescriptions Prior to Admission  Medication Sig Dispense Refill Last Dose  . Prenat w/o A-FeCbn-Meth-FA-DHA (PRENATE MINI) 29-0.6-0.4-350 MG CAPS Take 1 capsule by mouth daily before breakfast. 90 capsule 3 07/16/2016 at Unknown time  . sodium chloride (OCEAN) 0.65 % SOLN nasal spray Place 1 spray into both nostrils as needed for congestion.   Past Week at Unknown time  . blood glucose meter kit and supplies Dispense based on patient and insurance preference. Use up to four times daily as directed. (FOR ICD-9 250.00, 250.01). Patient to check glucose levels fasting am, and 2 hours after meals.  (Patient not taking: Reported on 07/02/2016) 1 each 5 Not Taking     Review of Systems   All systems reviewed and negative except as stated in HPI  Blood pressure 142/95, pulse 87, temperature 98.2 F (36.8 C), temperature source Oral, resp. rate 16, height 5' 7"  (1.702 m), weight 216 lb (98 kg), last menstrual period 09/28/2015. General appearance: alert, cooperative and appears stated age Lungs: clear to auscultation bilaterally Heart: regular rate and rhythm Abdomen: soft, non-tender; bowel sounds normal Extremities: No calf swelling or tenderness Presentation: cephalic Fetal monitoring: Cat I tracing Uterine activity: irregular Dilation: Closed Effacement (%): 30 Station: -3 Exam by:: jolynn   Prenatal labs: ABO, Rh: O/Positive/-- (06/16 1321) Antibody: Negative (06/16 1321) Rubella: !Error! RPR: Non Reactive (08/31 1345)  HBsAg: Negative (06/16 1321)  HIV: Non Reactive (08/31 1345)  GBS:    3rd trimester 3hr GTT: 97/106/73 Genetic screening:  declined Anatomy [place] normal 19 week [place] Prenatal Transfer Tool  Maternal Diabetes: No Genetic Screening: Declined Maternal Ultrasounds/Referrals: Normal Fetal Ultrasounds or other Referrals:  None Maternal Substance Abuse:  No Significant Maternal Medications:  None Significant Maternal Lab Results: None  Results for orders placed or performed during the hospital encounter of 07/16/16 (from the past 24 hour(s))  Group B strep  by PCR   Collection Time: 07/16/16  2:08 PM  Result Value Ref Range   Group B strep by PCR POSITIVE (A) NEGATIVE    Patient Active Problem List   Diagnosis Date Noted  . Chronic hypertension 07/16/2016  . Gestational diabetes 06/04/2016  . Elevated blood pressure affecting pregnancy in second trimester, antepartum 04/04/2016  . Supervision of high-risk pregnancy 02/02/2016    Assessment: Alicia Gay is a 25 y.o. G1P0 at 28w1dhere for IOL for chronic HTN  #Labor:Anticipate SVD. Cytotec  for cervical ripening. Plan for pitocin for augmentation #Pain: Pain meds IV prn/epidural declined #FWB: Cat I tracing #ID:  GBS postive-PCN #MOF: Breast #MOC:Pills #Circ:  Outpatient #chronic HTN: will trend blood pressures .If persistently elevated consider PO medications  WALLACE, [place] DO PGY-3 07/16/2016, 3:49 PM   OB FELLOW HISTORY AND PHYSICAL ATTESTATION  I have seen and examined this patient; I agree with above documentation in the resident's note.    NJacquiline Doe11/28/2017, 6:26 PM

## 2016-07-16 NOTE — MAU Note (Addendum)
Pt was due yesterday.  Unable to make a same day appointment.  Wanting to be examined to see if dilated.  Denies pain, bleeding or leaking.  Does not have an another appt scheduled.

## 2016-07-16 NOTE — MAU Provider Note (Signed)
History     CSN: [id number]  Arrival date and time: 07/16/16 1248   First Provider Initiated Contact with Patient 07/16/16 1437      Chief Complaint  Patient presents with  . check up   HPI Alicia Gay is a 25 y.o. G1P0 at [redacted]w[redacted]d who presents for a "check up". Patient states she hasn't had her cervix checked in over a week & wants it checked today. Per review of records, pt was sent from office for IOL on 11/21 but never arrived. Per notes, several attempts were made to contact patient.  When asked about IOL; pt states that someone from the office came to her house & saw her picture of normal BPs taken at home & said "it was ok" -- this is not indicated in notes. States she does not think she has hypertension; that she just gets nervous at the office and doesn't want to be induced for no reason.  Denies headache, vision changes, epigastric pain, contractions, vaginal bleeding, or LOF. Positive fetal movement.   OB History    Gravida Para Term Preterm AB Living   1         0   SAB TAB Ectopic Multiple Live Births                  Past Medical History:  Diagnosis Date  . Diabetes mellitus without complication (HCC)   . Gestational diabetes   . Hypertension     Past Surgical History:  Procedure Laterality Date  . NO PAST SURGERIES      Family History  Problem Relation Age of Onset  . Cancer Maternal Aunt   . Diabetes Paternal Grandmother     Social History  Substance Use Topics  . Smoking status: Never Smoker  . Smokeless tobacco: Never Used  . Alcohol use No     Comment: prior to preg    Allergies: No Known Allergies  Prescriptions Prior to Admission  Medication Sig Dispense Refill Last Dose  . Prenat w/o A-FeCbn-Meth-FA-DHA (PRENATE MINI) 29-0.6-0.4-350 MG CAPS Take 1 capsule by mouth daily before breakfast. 90 capsule 3 07/16/2016 at Unknown time  . sodium chloride (OCEAN) 0.65 % SOLN nasal spray Place 1 spray into both nostrils as needed for congestion.    Past Week at Unknown time  . blood glucose meter kit and supplies Dispense based on patient and insurance preference. Use up to four times daily as directed. (FOR ICD-9 250.00, 250.01). Patient to check glucose levels fasting am, and 2 hours after meals. (Patient not taking: Reported on 07/02/2016) 1 each 5 Not Taking    Review of Systems  Constitutional: Negative.   Eyes: Negative for blurred vision.  Gastrointestinal: Negative.   Genitourinary: Negative.   Neurological: Negative for dizziness and headaches.   Physical Exam   Blood pressure (!) 139/103, pulse 81, temperature 98.2 F (36.8 C), temperature source Oral, resp. rate 16, last menstrual period 09/28/2015. Temp:  [98.2 F (36.8 C)] 98.2 F (36.8 C) (11/28 1322) Pulse Rate:  [81-87] 87 (11/28 1410) Resp:  [16] 16 (11/28 1322) BP: (139-151)/(94-103) 142/95 (11/28 1410)   Physical Exam  Nursing note and vitals reviewed. Constitutional: She is oriented to person, place, and time. She appears well-developed and well-nourished. No distress.  HENT:  Head: Normocephalic and atraumatic.  Eyes: Conjunctivae are normal. Right eye exhibits no discharge. Left eye exhibits no discharge. No scleral icterus.  Neck: Normal range of motion.  Respiratory: Effort normal. No respiratory distress.  Neurological:  She is alert and oriented to person, place, and time.  Skin: Skin is warm and dry. She is not diaphoretic.  Psychiatric: She has a normal mood and affect. Her behavior is normal. Judgment and thought content normal.   Dilation: Closed Effacement (%): 30 Cervical Position: Middle, Posterior Station: -3 Exam by:: jolynn  Fetal Tracing:  Baseline: 145 Variability: moderate Accelerations: 15x15  Decelerations: none  Toco: irregular ctx MAU Course  Procedures No results found for this or any previous visit (from the past 24 hour(s)).  MDM Category 1 tracing Elevated BPs S/w Dr. Kennon Rounds -- will admit to birthing suites for  IOL. Dr. Baron Sane to MAU to discuss plan with patient  Assessment and Plan  A: Gestational hypertension in pregnancy at term  P: Admit to birthing suites GBS collected [facility] labs ordered  Jorje Guild 07/16/2016, 1:35 PM

## 2016-07-17 DIAGNOSIS — O99824 Streptococcus B carrier state complicating childbirth: Secondary | ICD-10-CM

## 2016-07-17 DIAGNOSIS — O24420 Gestational diabetes mellitus in childbirth, diet controlled: Secondary | ICD-10-CM

## 2016-07-17 DIAGNOSIS — Z3A40 40 weeks gestation of pregnancy: Secondary | ICD-10-CM

## 2016-07-17 DIAGNOSIS — O134 Gestational [pregnancy-induced] hypertension without significant proteinuria, complicating childbirth: Secondary | ICD-10-CM

## 2016-07-17 LAB — RPR: RPR Ser Ql: NONREACTIVE

## 2016-07-17 LAB — HIV ANTIBODY (ROUTINE TESTING W REFLEX): HIV Screen 4th Generation wRfx: NONREACTIVE

## 2016-07-17 LAB — GLUCOSE, CAPILLARY: GLUCOSE-CAPILLARY: 95 mg/dL (ref 65–99)

## 2016-07-17 MED ORDER — HYDRALAZINE HCL 20 MG/ML IJ SOLN: 10.0000 mg | Freq: Once | INTRAMUSCULAR | Status: DC | PRN

## 2016-07-17 MED ORDER — MISOPROSTOL 200 MCG PO TABS
50.0000 ug | ORAL_TABLET | ORAL | Status: DC | PRN
  Administered 2016-07-18 (×2): 50 ug via ORAL
  Filled 2016-07-17 (×2): qty 0.5

## 2016-07-17 MED ORDER — MAGNESIUM SULFATE BOLUS VIA INFUSION
4.0000 g | Freq: Once | INTRAVENOUS | Status: AC
  Administered 2016-07-17: 4 g via INTRAVENOUS
  Filled 2016-07-17: qty 500

## 2016-07-17 MED ORDER — MAGNESIUM SULFATE 50 % IJ SOLN
2.0000 g/h | INTRAVENOUS | Status: DC
  Administered 2016-07-18: 2 g/h via INTRAVENOUS
  Filled 2016-07-17 (×2): qty 80

## 2016-07-17 MED ORDER — ZOLPIDEM TARTRATE 5 MG PO TABS
5.0000 mg | ORAL_TABLET | Freq: Every evening | ORAL | Status: DC | PRN
  Administered 2016-07-17: 5 mg via ORAL
  Filled 2016-07-17: qty 1

## 2016-07-17 MED ORDER — LABETALOL HCL 5 MG/ML IV SOLN
20.0000 mg | INTRAVENOUS | Status: DC | PRN
  Administered 2016-07-17: 20 mg via INTRAVENOUS
  Filled 2016-07-17: qty 4

## 2016-07-17 NOTE — Progress Notes (Signed)
Pt seems slightly anxious.  Has been on the internet researching cytotec and is upset regarding the side effects.  Encouragement given.  Kem ParkinsonM Bhrambri CNM updated on maternal blood pressures and pt's concerns regarding the use of cytotec for induction.  Will come and talk with pt.

## 2016-07-17 NOTE — Progress Notes (Signed)
Patient ID: Alicia Gay, female   DOB: 10/14/1990, 25 y.o.   MRN: [id number]  Comfortable; doing well; just started on Mag Sulfate due to severe range BPs VSS 137/96 after Lab 20 x 1 FHR 150s, +accels, no decels Ctx q 3 mins Cx 1/50/-2, med consistency  IUP@term  IOL process Cx unfavorable Pre-e w/ severe s/s  Foley bulb inserted without difficulty Will give po cytotec when ctx space out  Cam HaiSHAW, KIMBERLY CNM 07/17/2016 10:07 PM

## 2016-07-17 NOTE — Progress Notes (Signed)
S: Patient seen & examined for progress of labor. Patient comfortable. No complaints at this time.    O:  Vitals:   07/17/16 0405 07/17/16 0818 07/17/16 1003 07/17/16 1200  BP: (!) 128/95 117/72 128/87 (!) 144/92  Pulse: (!) 58 66 78 63  Resp: 17     Temp: 98 F (36.7 C) 98.2 F (36.8 C)  98.1 F (36.7 C)  TempSrc: Oral Oral  Oral  Weight:      Height:        Dilation: Closed Effacement (%): 50 Cervical Position: Middle Station: -2 Presentation: Vertex Exam by:: J.Cox, RN   FHT: Cat I tracing TOCO: q7-8 min/irregular   A/P: Cytotec Continue expectant management Anticipate SVD  PGY-3

## 2016-07-17 NOTE — Progress Notes (Signed)
Labor Progress Note Alicia Gay is a 25 y.o. G1P0 at 749w2d presented for IOL for CHTN  S:  Comfortable, feeling mild ctx  O:  BP (!) 151/95   Pulse (!) 52   Temp 98.1 F (36.7 C) (Oral)   Resp 17   Ht 5\' 7"  (1.702 m)   Wt 98 kg (216 lb)   LMP 09/28/2015 (Exact Date)   BMI 33.83 kg/m  EFM: baseline 145 bpm/ mod variability/ + accels/ no decels  Toco: irregular, mild  SVE: Dilation: Closed Effacement (%): 50 Cervical Position: Middle Station: -2 Presentation: Vertex Exam by:: Alicia Gay, CNM   A/P: 25 y.o. G1P0 4949w2d  1. Labor: IOL, ripening 2. Chronic HTN-stable 3. FWB: Cat I 4. Pain: analgesia prn Continue ripening with Cytotec. Foley balloon once dilated. Anticipate SVD.  Donette LarryMelanie Theodore Virgin, CNM 5:34 PM

## 2016-07-18 LAB — GLUCOSE, CAPILLARY
GLUCOSE-CAPILLARY: 107 mg/dL — AB (ref 65–99)
Glucose-Capillary: 84 mg/dL (ref 65–99)
Glucose-Capillary: 96 mg/dL (ref 65–99)
Glucose-Capillary: 80 mg/dL (ref 65–99)

## 2016-07-18 MED ORDER — PENICILLIN G POTASSIUM 5000000 UNITS IJ SOLR
5.0000 10*6.[IU] | Freq: Once | INTRAVENOUS | Status: AC
  Administered 2016-07-18: 5 10*6.[IU] via INTRAVENOUS
  Filled 2016-07-18: qty 5

## 2016-07-18 MED ORDER — TERBUTALINE SULFATE 1 MG/ML IJ SOLN
0.2500 mg | Freq: Once | INTRAMUSCULAR | Status: DC | PRN
Start: 2016-07-18 — End: 2016-07-19

## 2016-07-18 MED ORDER — OXYTOCIN 40 UNITS IN LACTATED RINGERS INFUSION - SIMPLE MED
1.0000 m[IU]/min | INTRAVENOUS | Status: DC
  Administered 2016-07-18: 2 m[IU]/min via INTRAVENOUS
  Filled 2016-07-18: qty 1000

## 2016-07-18 MED ORDER — PENICILLIN G POT IN DEXTROSE 60000 UNIT/ML IV SOLN
3.0000 10*6.[IU] | INTRAVENOUS | Status: DC
  Administered 2016-07-18 – 2016-07-19 (×4): 3 10*6.[IU] via INTRAVENOUS
  Filled 2016-07-18 (×6): qty 50

## 2016-07-18 NOTE — Progress Notes (Signed)
Patient ID: Alicia Gay, female   DOB: 12/08/1990, 25 y.o.   MRN: [id number]  S: Patient seen & examined for progress of induction of labor. Patient comfortable and rarely feeling contractions. Discussed further methods of induction of labor, including AROM and pitocin. Patient would like to not do pitocin at this time, but amenable to AROM.   O:  Vitals:   07/18/16 1320 07/18/16 1326 07/18/16 1400 07/18/16 1500  BP: (!) 146/105 (!) 145/98 (!) 133/92 121/79  Pulse: 85 65 65 74  Resp: 18 18  18   Temp:    98.5 F (36.9 C)  TempSrc:    Oral  Weight:      Height:        Dilation: 4.5 Effacement (%): 70 Cervical Position: Anterior Station: -2 Presentation: Vertex Exam by:: raney gagnon rnc  AROM performed, moderate meconium stained fluid return. Patient and baby tolerated procedure well.  FHT: 140bpm, mod var, +accels, no decels TOCO: Rare   A/P: AROM performed- mod mec Will await for contractions, as patient is refusing pitocin, discussed with patient if no further change in contractions or cervical dilation, recommend pitocin. Continue expectant management Anticipate SVD

## 2016-07-18 NOTE — Progress Notes (Signed)
Patient with AROM for 5 hours without significant change in contraction pattern and still feeling very comfortable. Discussed the presence of meconium stained fluid and its potential fetal impact. Discussed the protracted state of her labor along with the fact that Magnesium sulfate acts as a tocolytic. Patient verbalized understanding. She desires to use a breast pump to stimulate regular contractions. We agreed on trying for 30 minutes. If no significant change, patient agrees to starting pitocin

## 2016-07-19 ENCOUNTER — Encounter (HOSPITAL_COMMUNITY): Payer: Self-pay

## 2016-07-19 DIAGNOSIS — O99824 Streptococcus B carrier state complicating childbirth: Secondary | ICD-10-CM

## 2016-07-19 DIAGNOSIS — O24420 Gestational diabetes mellitus in childbirth, diet controlled: Secondary | ICD-10-CM

## 2016-07-19 DIAGNOSIS — Z3A40 40 weeks gestation of pregnancy: Secondary | ICD-10-CM

## 2016-07-19 DIAGNOSIS — O134 Gestational [pregnancy-induced] hypertension without significant proteinuria, complicating childbirth: Secondary | ICD-10-CM

## 2016-07-19 LAB — GLUCOSE, CAPILLARY: GLUCOSE-CAPILLARY: 94 mg/dL (ref 65–99)

## 2016-07-19 MED ORDER — DIPHENHYDRAMINE HCL 25 MG PO CAPS: 25.0000 mg | ORAL_CAPSULE | Freq: Four times a day (QID) | ORAL | Status: DC | PRN

## 2016-07-19 MED ORDER — METHYLERGONOVINE MALEATE 0.2 MG/ML IJ SOLN: 0.2000 mg | INTRAMUSCULAR | Status: DC | PRN

## 2016-07-19 MED ORDER — ONDANSETRON HCL 4 MG PO TABS: 4.0000 mg | ORAL_TABLET | ORAL | Status: DC | PRN

## 2016-07-19 MED ORDER — WITCH HAZEL-GLYCERIN EX PADS: 1.0000 "application " | MEDICATED_PAD | CUTANEOUS | Status: DC | PRN

## 2016-07-19 MED ORDER — DIBUCAINE 1 % RE OINT: 1.0000 "application " | TOPICAL_OINTMENT | RECTAL | Status: DC | PRN

## 2016-07-19 MED ORDER — SENNOSIDES-DOCUSATE SODIUM 8.6-50 MG PO TABS
2.0000 | ORAL_TABLET | ORAL | Status: DC
  Administered 2016-07-19 – 2016-07-21 (×2): 2 via ORAL
  Filled 2016-07-19 (×2): qty 2

## 2016-07-19 MED ORDER — ONDANSETRON HCL 4 MG/2ML IJ SOLN: 4.0000 mg | INTRAMUSCULAR | Status: DC | PRN

## 2016-07-19 MED ORDER — OXYCODONE HCL 5 MG PO TABS: 5.0000 mg | ORAL_TABLET | ORAL | Status: DC | PRN

## 2016-07-19 MED ORDER — IBUPROFEN 600 MG PO TABS
600.0000 mg | ORAL_TABLET | Freq: Four times a day (QID) | ORAL | Status: DC
  Administered 2016-07-19 – 2016-07-21 (×7): 600 mg via ORAL
  Filled 2016-07-19 (×8): qty 1

## 2016-07-19 MED ORDER — COCONUT OIL OIL
1.0000 "application " | TOPICAL_OIL | Status: DC | PRN
  Administered 2016-07-20: 1 via TOPICAL
  Filled 2016-07-19: qty 120

## 2016-07-19 MED ORDER — ACETAMINOPHEN 325 MG PO TABS: 650.0000 mg | ORAL_TABLET | ORAL | Status: DC | PRN

## 2016-07-19 MED ORDER — BENZOCAINE-MENTHOL 20-0.5 % EX AERO: 1.0000 "application " | INHALATION_SPRAY | CUTANEOUS | Status: DC | PRN

## 2016-07-19 MED ORDER — OXYTOCIN 40 UNITS IN LACTATED RINGERS INFUSION - SIMPLE MED: 2.5000 [IU]/h | INTRAVENOUS | Status: DC | PRN

## 2016-07-19 MED ORDER — MAGNESIUM SULFATE 50 % IJ SOLN
2.0000 g/h | INTRAVENOUS | Status: DC
  Administered 2016-07-19: 2 g/h via INTRAVENOUS
  Filled 2016-07-19 (×2): qty 80

## 2016-07-19 MED ORDER — TETANUS-DIPHTH-ACELL PERTUSSIS 5-2.5-18.5 LF-MCG/0.5 IM SUSP: 0.5000 mL | Freq: Once | INTRAMUSCULAR | Status: DC

## 2016-07-19 MED ORDER — MEASLES, MUMPS & RUBELLA VAC ~~LOC~~ INJ: 0.5000 mL | INJECTION | Freq: Once | SUBCUTANEOUS | Status: DC

## 2016-07-19 MED ORDER — SIMETHICONE 80 MG PO CHEW: 80.0000 mg | CHEWABLE_TABLET | ORAL | Status: DC | PRN

## 2016-07-19 MED ORDER — LACTATED RINGERS IV SOLN
INTRAVENOUS | Status: DC
  Administered 2016-07-20: 01:00:00 via INTRAVENOUS

## 2016-07-19 MED ORDER — OXYCODONE HCL 5 MG PO TABS: 10.0000 mg | ORAL_TABLET | ORAL | Status: DC | PRN

## 2016-07-19 MED ORDER — METHYLERGONOVINE MALEATE 0.2 MG PO TABS: 0.2000 mg | ORAL_TABLET | ORAL | Status: DC | PRN

## 2016-07-19 MED ORDER — PRENATAL MULTIVITAMIN CH
1.0000 | ORAL_TABLET | Freq: Every day | ORAL | Status: DC
  Administered 2016-07-19 – 2016-07-20 (×2): 1 via ORAL
  Filled 2016-07-19 (×2): qty 1

## 2016-07-19 NOTE — Progress Notes (Signed)
FHR Cat 1 and 2 for the past several hours (Now Cat 1). Baseline 120's, some prolonged variable decels.  Variability now moderate, no decels.  Cx finally changing, 7cms at +1 Station.  Ctx q 2-4 minutes, moderate  Pitocin on 818mu.min.   MgSo4 at 2gm/hr.   Vitals:   07/19/16 0500 07/19/16 0630  BP:  136/84  Pulse:  90  Resp: 20 20  Temp:  98.9 F (37.2 C)   If Baby will continue to tolerate labor, anticipate SVD> Dr. Erin FullingHarraway-Smith reviewed strip.

## 2016-07-19 NOTE — Progress Notes (Signed)
No uterine activity  No change in cervix.  FHR cat 1.   Vitals:   07/18/16 2330 07/19/16 0000  BP: 133/74 (!) 142/94  Pulse: 80 65  Resp:    Temp: 98 F (36.7 C)    Took Tylenol for a HA--starting to ease off. Will start Pitocin now

## 2016-07-19 NOTE — Progress Notes (Signed)
UR chart review completed.  

## 2016-07-19 NOTE — Lactation Note (Signed)
This note was copied from a baby's chart. Lactation Consultation Note  Patient Name: Boy Vickey SagesMiquetta Burgner XBMWU'XToday's Date: 07/19/2016 Reason for consult: Initial assessment  Baby 7 hours old. Mom nursing baby when this LC entered the room, in football position on right breast. Demonstrated to mom how to flange baby's lower lip, and discussed with mom how to safely remove baby from breast in order to latch more deeply. Enc mom to continue to nurse with cues, and discussed alternating the breast with which she nurses to promote a good supply of breast milk in both. [facility], aware of OP/BFSG and LC phone line assistance after d/c.    Maternal Data Has patient been taught Hand Expression?: Yes Does the patient have breastfeeding experience prior to this delivery?: No  Feeding Feeding Type: Breast Fed Length of feed: 20 min  LATCH Score/Interventions Latch: Grasps breast easily, tongue down, lips flanged, rhythmical sucking.  Audible Swallowing: A few with stimulation Intervention(s): Skin to skin Intervention(s): Skin to skin  Type of Nipple: Everted at rest and after stimulation  Comfort (Breast/Nipple): Soft / non-tender     Hold (Positioning): Assistance needed to correctly position infant at breast and maintain latch. Intervention(s): Support Pillows  LATCH Score: 8  Lactation Tools Discussed/Used     Consult Status Consult Status: Follow-up Date: 07/20/16 Follow-up type: In-patient    Sherlyn HayJennifer D Macalister Arnaud 07/19/2016, 2:46 PM

## 2016-07-19 NOTE — Progress Notes (Signed)
Pt breathing well through contractions, on birthing ball, support by family member.  Uterine contractions every 2-4 minutes. Category I tracing: FHT 130s, moderate variability, 15 x 15 accels, no decels. Pt denies HA, visual disturbances. +2 Reflexes, negative clonus. Continue expectant management, anticipate SVD.  VSS: BP 138/91, Oral temp 98.20F, Pulse 70, RR 18

## 2016-07-20 LAB — GLUCOSE, CAPILLARY: Glucose-Capillary: 92 mg/dL (ref 65–99)

## 2016-07-20 NOTE — Lactation Note (Signed)
This note was copied from a baby's chart. Lactation Consultation Note  Patient Name: Alicia Gay SagesMiquetta Stanhope ZOXWR'UToday's Date: 07/20/2016 Reason for consult: Follow-up assessment  With this first time mom and term baby, now 6729 hours old. Baby was in CNS getting a hearing test. Mom denies and questions/conerns at this time, and baby seems to be breast feeding well, with good voids and dirty diapers, and stable weight.    Maternal Data    Feeding Feeding Type: Breast Fed Length of feed: 20 min  LATCH Score/Interventions                      Lactation Tools Discussed/Used     Consult Status Consult Status: PRN Follow-up type: Call as needed    Alfred LevinsLee, Jacara Benito Anne 07/20/2016, 12:55 PM

## 2016-07-20 NOTE — Lactation Note (Signed)
This note was copied from a baby's chart. Lactation Consultation Note  Patient Name: Boy Vickey SagesMiquetta Scrivener ZOXWR'UToday's Date: 07/20/2016 Reason for consult: Follow-up assessment   With this mom of a term infant, now 2630 hours old. I showed mom how to position herself and baby for football hold, and how to use an asymmetrical latch to obtain a deep latch. The baby latched deeply, with strong, rhythmic suckles with stimulation. Somewhat sleepy at this time. Basic breastfeeding teaching done with mom, on hand expression, milk coming in, engorgement. Mom doing wellm breasts full but soft, easily expressed transitional milk. Mom knows to feed 8-12 times a day, and that the baby will probably cluster feed tonight. Mom knows to call for questions/conerns.    Maternal Data    Feeding Feeding Type: Breast Fed Length of feed: 10 min  LATCH Score/Interventions Latch: Grasps breast easily, tongue down, lips flanged, rhythmical sucking.  Audible Swallowing: A few with stimulation Intervention(s): Skin to skin;Hand expression  Type of Nipple: Everted at rest and after stimulation  Comfort (Breast/Nipple): Soft / non-tender     Hold (Positioning): Assistance needed to correctly position infant at breast and maintain latch.  LATCH Score: 8  Lactation Tools Discussed/Used Pump Review: Setup, frequency, and cleaning;Other (comment) (hand expression shown to mom)   Consult Status Consult Status: Follow-up Date: 07/21/16 Follow-up type: In-patient    Alfred LevinsLee, Davionte Lusby Anne 07/20/2016, 1:46 PM

## 2016-07-20 NOTE — Progress Notes (Signed)
Post Partum Day 1 Subjective: no complaints and tolerating PO  Objective: Blood pressure 129/90, pulse (!) 57, temperature 98.4 F (36.9 C), temperature source Oral, resp. rate 15, height 5\' 7"  (1.702 m), weight 97.1 kg (214 lb), last menstrual period 09/28/2015, SpO2 98 %, unknown if currently breastfeeding.  Physical Exam:  General: alert, cooperative and no distress Lochia: appropriate Uterine Fundus: firm Incision: n/a  DVT Evaluation: No evidence of DVT seen on physical exam.  No results for input(s): HGB, HCT in the last 72 hours.  Assessment/Plan: Breastfeeding, d/c magnesium   LOS: 4 days   ARNOLD,JAMES 07/20/2016, 7:44 AM

## 2016-07-21 MED ORDER — IBUPROFEN 600 MG PO TABS: 600.0000 mg | ORAL_TABLET | Freq: Four times a day (QID) | ORAL | 0 refills | Status: DC

## 2016-07-21 MED ORDER — NORETHINDRONE 0.35 MG PO TABS: 1.0000 | ORAL_TABLET | Freq: Every day | ORAL | 11 refills | Status: DC

## 2016-07-21 NOTE — Discharge Summary (Signed)
OB Discharge Summary  Patient Name: Alicia Gay DOB: Sep 21, 1990 MRN: [id number]  Date of admission: 07/16/2016 Delivering MD: [place], El[place]   Date of discharge: 07/21/2016  Admitting diagnosis: CHECK UP Intrauterine pregnancy: [redacted]w[redacted]d    Secondary diagnosis:Active Problems:   Chronic hypertension  Additional problems:none     Discharge diagnosis: Term Pregnancy Delivered, Preeclampsia (severe) and GDM A1                                                                     Post partum procedures:none  Augmentation: Pitocin  Complications: None  Hospital course:  Onset of Labor With Vaginal Delivery     25y.o. yo G1P1001 at 462w4das admitted in Latent Labor on 07/16/2016. Patient had an uncomplicated labor course as follows:  Membrane Rupture Time/Date: 3:40 PM ,07/18/2016   Intrapartum Procedures: Episiotomy: None [1]                                         Lacerations:  None [1]  Patient had a delivery of a Viable infant. 07/19/2016  Information for the patient's newborn:  WrKiela, Shisler0[[id number]]Delivery Method: Vag-Spont    Pateint had an uncomplicated postpartum course.  She is ambulating, tolerating a regular diet, passing flatus, and urinating well. Patient is discharged home in stable condition on 07/21/16.    Physical exam Vitals:   07/20/16 2037 07/21/16 0100 07/21/16 0628 07/21/16 0750  BP:  127/89 (!) 125/96 110/80  Pulse: 66 (!) 58 (!) 56 (!) 53  Resp: 16 18 18 18   Temp: 97.7 F (36.5 C) 98.6 F (37 C) 98.2 F (36.8 C) 98.6 F (37 C)  TempSrc: Oral Oral Oral Oral  SpO2: 100% 98% 99% 99%  Weight:      Height:       General: alert, cooperative and no distress Lochia: appropriate Uterine Fundus: firm Incision: N/A DVT Evaluation: No evidence of DVT seen on physical exam. Labs: Lab Results  Component Value Date   WBC 6.2 07/16/2016   HGB 12.6 07/16/2016   HCT 36.6 07/16/2016   MCV 80.3 07/16/2016   PLT 209  07/16/2016   CMP Latest Ref Rng & Units 07/16/2016  Glucose 65 - 99 mg/dL 75  BUN 6 - 20 mg/dL 11  Creatinine 0.44 - 1.00 mg/dL 0.63  Sodium 135 - 145 mmol/L 133(L)  Potassium 3.5 - 5.1 mmol/L 4.1  Chloride 101 - 111 mmol/L 103  CO2 22 - 32 mmol/L 23  Calcium 8.9 - 10.3 mg/dL 9.7  Total Protein 6.5 - 8.1 g/dL 6.4(L)  Total Bilirubin 0.3 - 1.2 mg/dL 0.6  Alkaline Phos 38 - 126 U/L 69  AST 15 - 41 U/L 31  ALT 14 - 54 U/L 26    Discharge instruction: per After Visit Summary and "Baby and Me Booklet".  After Visit Meds:    Medication List    STOP taking these medications   blood glucose meter kit and supplies     TAKE these medications   ibuprofen 600 MG tablet Commonly known as:  ADVIL,MOTRIN Take 1 tablet (600 mg total) by mouth  every 6 (six) hours.   norethindrone 0.35 MG tablet Commonly known as:  CAMILA Take 1 tablet (0.35 mg total) by mouth daily.   PRENATE MINI 29-0.6-0.4-350 MG Caps Take 1 capsule by mouth daily before breakfast.   sodium chloride 0.65 % Soln nasal spray Commonly known as:  OCEAN Place 1 spray into both nostrils as needed for congestion.       Diet: routine diet  Activity: Advance as tolerated. Pelvic rest for 6 weeks.   Outpatient follow MO:[id number] in clinic for BP check Follow up Appt:No future appointments. Follow up visit: No Follow-up on file.  Postpartum contraception: Progesterone only pills  Newborn Data: Live born female  Birth Weight: 7 lb 10 oz (3459 g) APGAR: 8, 9  Baby Feeding: Breast Disposition:home with mother   07/21/2016 Koren Shiver, CNM

## 2016-07-21 NOTE — Progress Notes (Signed)
Discharge instructions reviewed with patient.  Patient states understanding of home care for self and baby, medications, activity, signs/symptoms to report to MD and return MD office visit for both.  Patients significant other and family will assist with her care @ home.  No home  equipment needed, patient has prescriptions and all personal belongings.  Patient ambulated for discharge in stable condition with staff without incident.  Baby discharged home with mother.

## 2016-07-21 NOTE — Lactation Note (Signed)
This note was copied from a baby's chart. Lactation Consultation Note  Mother states she needs more review of breastfeeding positions since it is still tender when she breastfeeds. Mother hand expressed drops of breastmilk from L side. Reviewed waking techniques.  Then taught mother how to wait for wide open gape and then bring baby deep onto breast. Mother stated that she felt more comfortable. Demonstrated how to massage/compress breast to keep baby active. Sucks and swallows observed. Discussed breastfeeding on both breasts per session. Mom encouraged to feed baby 8-12 times/24 hours and with feeding cues.  Reviewed engorgement care and monitoring voids/stools. Recommend she call if she needs further assistance.    Patient Name: Boy Vickey SagesMiquetta Bonifas YNWGN'FToday's Date: 07/21/2016 Reason for consult: Follow-up assessment   Maternal Data    Feeding Feeding Type: Breast Fed  LATCH Score/Interventions Latch: Grasps breast easily, tongue down, lips flanged, rhythmical sucking.  Audible Swallowing: A few with stimulation Intervention(s): Hand expression  Type of Nipple: Everted at rest and after stimulation  Comfort (Breast/Nipple): Soft / non-tender     Hold (Positioning): Assistance needed to correctly position infant at breast and maintain latch.  LATCH Score: 8  Lactation Tools Discussed/Used     Consult Status Consult Status: Complete    Hardie PulleyBerkelhammer, Arville Postlewaite Boschen 07/21/2016, 11:50 AM

## 2016-07-24 ENCOUNTER — Ambulatory Visit: Payer: Medicaid Other

## 2016-07-24 ENCOUNTER — Encounter: Payer: Medicaid Other | Admitting: *Deleted

## 2016-07-24 VITALS — BP 132/91

## 2016-07-24 DIAGNOSIS — Z0130 Encounter for examination of blood pressure without abnormal findings: Secondary | ICD-10-CM

## 2016-07-24 NOTE — Progress Notes (Signed)
Patient is in the office for BP check, 132/91; made provider aware.

## 2016-08-22 ENCOUNTER — Ambulatory Visit (INDEPENDENT_AMBULATORY_CARE_PROVIDER_SITE_OTHER): Payer: Medicaid Other | Admitting: Certified Nurse Midwife

## 2016-08-22 ENCOUNTER — Encounter: Payer: Self-pay | Admitting: *Deleted

## 2016-08-22 ENCOUNTER — Encounter: Payer: Self-pay | Admitting: Certified Nurse Midwife

## 2016-08-22 VITALS — BP 127/81 | HR 57 | Temp 98.6°F | Wt 200.6 lb

## 2016-08-22 DIAGNOSIS — O24410 Gestational diabetes mellitus in pregnancy, diet controlled: Secondary | ICD-10-CM

## 2016-08-22 DIAGNOSIS — O0993 Supervision of high risk pregnancy, unspecified, third trimester: Secondary | ICD-10-CM

## 2016-08-22 DIAGNOSIS — I10 Essential (primary) hypertension: Secondary | ICD-10-CM

## 2016-08-22 NOTE — Progress Notes (Signed)
Subjective:     Alicia Gay is a 26 y.o. female who presents for a postpartum visit. She is 4 weeks postpartum following a spontaneous vaginal delivery. I have fully reviewed the prenatal and intrapartum course. The delivery was at 2842w4d gestational weeks. Outcome: spontaneous vaginal delivery. Anesthesia: none. Postpartum course has been normal. Baby's course has been normal. Baby is feeding by breast. Bleeding no bleeding. Bowel function is normal. Bladder function is normal. Patient is not sexually active. Contraception method is abstinence. Postpartum depression screening: negative.  Tobacco, alcohol and substance abuse history reviewed.  Adult immunizations reviewed including TDAP, rubella and varicella.  The following portions of the patient's history were reviewed and updated as appropriate: allergies, current medications, past family history, past medical history, past social history, past surgical history and problem list.  Review of Systems Pertinent items are noted in HPI.   Objective:    BP 127/81   Pulse (!) 57   Temp 98.6 F (37 C) (Oral)   Wt 200 lb 9.6 oz (91 kg)   Breastfeeding? Yes   BMI 31.42 kg/m   General:  alert, cooperative and no distress   Breasts:  inspection negative, no nipple discharge or bleeding, no masses or nodularity palpable  Lungs: clear to auscultation bilaterally  Heart:  regular rate and rhythm, S1, S2 normal, no murmur, click, rub or gallop  Abdomen: soft, non-tender; bowel sounds normal; no masses,  no organomegaly  pelvic Exam: Not performed.          50% of 20 min visit spent on counseling and coordination of care.   Assessment:     Normal 4 week postpartum exam. Pap smear not done at today's visit.    Hx of GDM: schedule 2 hour OGTT in 2 weeks  F/U on hx of ASCUS, +HPV in 6 months with annual exam  GHTN vs CHTN  Plan:    1. Contraception: abstinence 2. Is starting on POP today, discussed condoms as well for the first month.   3.  Hx of GDM this pregnancy: needs 2 hour OGTT in 2 weeks 4. Hx of GHTN versus CHTN: normotensive today, no medications 3. Follow up in: 6 months or as needed.  2hr GTT for h/o GDM/screening for DM q 3 yrs per ADA recommendations Preconception counseling provided Healthy lifestyle practices reviewed  Return back to work [employer]ML[employer] paperwork completed.

## 2016-08-23 ENCOUNTER — Encounter: Payer: Self-pay | Admitting: Certified Nurse Midwife

## 2016-09-09 ENCOUNTER — Other Ambulatory Visit: Payer: Medicaid Other

## 2018-06-25 ENCOUNTER — Ambulatory Visit (INDEPENDENT_AMBULATORY_CARE_PROVIDER_SITE_OTHER): Payer: Medicaid Other

## 2018-06-25 VITALS — BP 144/89 | HR 69 | Ht 67.0 in | Wt 200.7 lb

## 2018-06-25 DIAGNOSIS — O0990 Supervision of high risk pregnancy, unspecified, unspecified trimester: Secondary | ICD-10-CM | POA: Insufficient documentation

## 2018-06-25 DIAGNOSIS — Z3201 Encounter for pregnancy test, result positive: Secondary | ICD-10-CM

## 2018-06-25 LAB — POCT URINE PREGNANCY: Preg Test, Ur: POSITIVE — AB

## 2018-06-25 NOTE — Progress Notes (Signed)
Alicia Gay presents today for UPT. She has no unusual complaints.  LMP: 03/31/18    OBJECTIVE: Appears well, in no apparent distress.  OB History    Gravida  2   Para  1   Term  1   Preterm      AB      Living  1     SAB      TAB      Ectopic      Multiple  0   Live Births  1          Home UPT Result: Positive In-Office UPT result: POSITIVE I have reviewed the patient's medical, obstetrical, social, and family histories, and medications.   ASSESSMENT: Positive pregnancy test  PLAN Prenatal care to be completed at: [facility]

## 2018-07-10 ENCOUNTER — Ambulatory Visit (INDEPENDENT_AMBULATORY_CARE_PROVIDER_SITE_OTHER): Payer: Medicaid Other | Admitting: Obstetrics

## 2018-07-10 ENCOUNTER — Encounter: Payer: Self-pay | Admitting: Obstetrics

## 2018-07-10 VITALS — BP 148/89 | HR 80 | Wt 200.4 lb

## 2018-07-10 DIAGNOSIS — O0991 Supervision of high risk pregnancy, unspecified, first trimester: Secondary | ICD-10-CM

## 2018-07-10 DIAGNOSIS — Z8632 Personal history of gestational diabetes: Secondary | ICD-10-CM

## 2018-07-10 DIAGNOSIS — O0990 Supervision of high risk pregnancy, unspecified, unspecified trimester: Secondary | ICD-10-CM

## 2018-07-10 DIAGNOSIS — I10 Essential (primary) hypertension: Secondary | ICD-10-CM

## 2018-07-10 NOTE — Progress Notes (Signed)
Subjective:    Alicia Gay is being seen today for her first obstetrical visit.  This is not a planned pregnancy. She is at 3628w3d gestation. Her obstetrical history is significant for GDM and Chronic HTN. Relationship with FOB: unknown. Patient does intend to breast feed. Pregnancy history fully reviewed.  The information documented in the HPI was reviewed and verified.  Menstrual History: OB History    Gravida  2   Para  1   Term  1   Preterm      AB      Living  1     SAB      TAB      Ectopic      Multiple  0   Live Births  1            Patient's last menstrual period was 03/31/2018 (approximate).    Past Medical History:  Diagnosis Date  . Diabetes mellitus without complication (HCC)   . Gestational diabetes   . Hypertension     Past Surgical History:  Procedure Laterality Date  . NO PAST SURGERIES       (Not in a hospital admission) No Known Allergies  Social History   Tobacco Use  . Smoking status: Never Smoker  . Smokeless tobacco: Never Used  Substance Use Topics  . Alcohol use: No    Comment: prior to preg    Family History  Problem Relation Age of Onset  . Cancer Maternal Aunt   . Diabetes Paternal Grandmother      Review of Systems Constitutional: negative for weight loss Gastrointestinal: negative for vomiting Genitourinary:negative for genital lesions and vaginal discharge and dysuria Musculoskeletal:negative for back pain Behavioral/Psych: negative for abusive relationship, depression, illegal drug usage and tobacco use    Objective:    BP (!) 148/89   Pulse 80   Wt 200 lb 6.4 oz (90.9 kg)   LMP 03/31/2018 (Approximate)   BMI 31.39 kg/m  General Appearance:    Alert, cooperative, no distress, appears stated age  Head:    Normocephalic, without obvious abnormality, atraumatic  Eyes:    PERRL, conjunctiva/corneas clear, EOM's intact, fundi    benign, both eyes  Ears:    Normal TM's and external ear canals, both ears   Nose:   Nares normal, septum midline, mucosa normal, no drainage    or sinus tenderness  Throat:   Lips, mucosa, and tongue normal; teeth and gums normal  Neck:   Supple, symmetrical, trachea midline, no adenopathy;    thyroid:  no enlargement/tenderness/nodules; no carotid   bruit or JVD  Back:     Symmetric, no curvature, ROM normal, no CVA tenderness  Lungs:     Clear to auscultation bilaterally, respirations unlabored  Chest Wall:    No tenderness or deformity   Heart:    Regular rate and rhythm, S1 and S2 normal, no murmur, rub   or gallop  Breast Exam:    No tenderness, masses, or nipple abnormality  Abdomen:     Soft, non-tender, bowel sounds active all four quadrants,    no masses, no organomegaly  Genitalia:    Normal female without lesion, discharge or tenderness  Extremities:   Extremities normal, atraumatic, no cyanosis or edema  Pulses:   2+ and symmetric all extremities  Skin:   Skin color, texture, turgor normal, no rashes or lesions  Lymph nodes:   Cervical, supraclavicular, and axillary nodes normal  Neurologic:   CNII-XII intact, normal strength, sensation  and reflexes    throughout      Lab Review Urine pregnancy test Labs reviewed yes Radiologic studies reviewed no  Assessment:     1. Supervision of high risk pregnancy, antepartum Rx: - Obstetric Panel, Including HIV - Culture, OB Urine - Cytology - PAP - SMN1 COPY NUMBER ANALYSIS (SMA Carrier Screen) - Cystic Fibrosis Mutation 97 - Hemoglobinopathy evaluation - Enroll Patient in Babyscripts - HgB A1c  2. Chronic hypertension - BP is stable  3. History of diet controlled gestational diabetes mellitus (GDM)    Plan:      Prenatal vitamins.  Counseling provided regarding continued use of seat belts, cessation of alcohol consumption, smoking or use of illicit drugs; infection precautions i.e., influenza/TDAP immunizations, toxoplasmosis,CMV, parvovirus, listeria and varicella; workplace safety,  exercise during pregnancy; routine dental care, safe medications, sexual activity, hot tubs, saunas, pools, travel, caffeine use, fish and methlymercury, potential toxins, hair treatments, varicose veins Weight gain recommendations per IOM guidelines reviewed: underweight/BMI< 18.5--> gain 28 - 40 lbs; normal weight/BMI 18.5 - 24.9--> gain 25 - 35 lbs; overweight/BMI 25 - 29.9--> gain 15 - 25 lbs; obese/BMI >30->gain  11 - 20 lbs Problem list reviewed and updated. FIRST/CF mutation testing/NIPT/QUAD SCREEN/fragile X/Ashkenazi Jewish population testing/Spinal muscular atrophy discussed: requested. Role of ultrasound in pregnancy discussed; fetal survey: requested. Amniocentesis discussed: not indicated   No orders of the defined types were placed in this encounter.  Orders Placed This Encounter  Procedures  . Culture, OB Urine  . Obstetric Panel, Including HIV  . SMN1 COPY NUMBER ANALYSIS (SMA Carrier Screen)  . Cystic Fibrosis Mutation 97  . Hemoglobinopathy evaluation  . HgB A1c    Follow up in 2 weeks. 50% of 20 min visit spent on counseling and coordination of care.     Brock Bad MD 07-10-2018

## 2018-07-10 NOTE — Progress Notes (Signed)
Flu: Declined  Genetic Screening:declined  Last pap: 02/02/2016 ASCUS  Exam declined pt dos not want to see a female provider. Ok per Dr.Harper for pt to do labs and return for NOB work up w/ female provider to do pap/ cultures.  FHT:150

## 2018-07-14 LAB — OBSTETRIC PANEL, INCLUDING HIV
ANTIBODY SCREEN: NEGATIVE
BASOS: 0 %
Basophils Absolute: 0 10*3/uL (ref 0.0–0.2)
EOS (ABSOLUTE): 0.1 10*3/uL (ref 0.0–0.4)
EOS: 1 %
HEMATOCRIT: 37.6 % (ref 34.0–46.6)
HEMOGLOBIN: 12.1 g/dL (ref 11.1–15.9)
HEP B S AG: NEGATIVE
HIV Screen 4th Generation wRfx: NONREACTIVE
IMMATURE GRANS (ABS): 0 10*3/uL (ref 0.0–0.1)
Immature Granulocytes: 1 %
LYMPHS: 12 %
Lymphocytes Absolute: 1 10*3/uL (ref 0.7–3.1)
MCH: 26.2 pg — ABNORMAL LOW (ref 26.6–33.0)
MCHC: 32.2 g/dL (ref 31.5–35.7)
MCV: 81 fL (ref 79–97)
MONOCYTES: 7 %
Monocytes Absolute: 0.6 10*3/uL (ref 0.1–0.9)
Neutrophils Absolute: 6.7 10*3/uL (ref 1.4–7.0)
Neutrophils: 79 %
Platelets: 277 10*3/uL (ref 150–450)
RBC: 4.62 x10E6/uL (ref 3.77–5.28)
RDW: 12.1 % — ABNORMAL LOW (ref 12.3–15.4)
RH TYPE: POSITIVE
RPR: NONREACTIVE
RUBELLA: 11.2 {index} (ref >0.99)
WBC: 8.5 10*3/uL (ref 3.4–10.8)

## 2018-07-14 LAB — HEMOGLOBINOPATHY EVALUATION
HEMOGLOBIN A2 QUANTITATION: 2.1 % (ref 1.8–3.2)
HEMOGLOBIN F QUANTITATION: 0 % (ref 0.0–2.0)
HGB C: 0 %
HGB S: 0 %
HGB VARIANT: 0 %
Hgb A: 97.9 % (ref 96.4–98.8)

## 2018-07-14 LAB — HEMOGLOBIN A1C
Est. average glucose Bld gHb Est-mCnc: 108 mg/dL
HEMOGLOBIN A1C: 5.4 % (ref 4.8–5.6)

## 2018-07-15 LAB — CULTURE, OB URINE

## 2018-07-15 LAB — URINE CULTURE, OB REFLEX

## 2018-07-21 LAB — SMN1 COPY NUMBER ANALYSIS (SMA CARRIER SCREENING)

## 2018-07-21 LAB — CYSTIC FIBROSIS MUTATION 97: Interpretation: NOT DETECTED

## 2018-07-29 ENCOUNTER — Encounter: Payer: Self-pay | Admitting: Certified Nurse Midwife

## 2018-07-29 ENCOUNTER — Other Ambulatory Visit (HOSPITAL_COMMUNITY)
Admission: RE | Admit: 2018-07-29 | Discharge: 2018-07-29 | Disposition: A | Discharge status: Home / Self Care | Payer: Medicaid Other | Source: Ambulatory Visit

## 2018-07-29 ENCOUNTER — Ambulatory Visit (INDEPENDENT_AMBULATORY_CARE_PROVIDER_SITE_OTHER): Payer: Medicaid Other | Admitting: Certified Nurse Midwife

## 2018-07-29 VITALS — BP 137/88 | HR 78 | Wt 205.0 lb

## 2018-07-29 DIAGNOSIS — O0990 Supervision of high risk pregnancy, unspecified, unspecified trimester: Secondary | ICD-10-CM

## 2018-07-29 DIAGNOSIS — Z3482 Encounter for supervision of other normal pregnancy, second trimester: Secondary | ICD-10-CM

## 2018-07-29 DIAGNOSIS — O10012 Pre-existing essential hypertension complicating pregnancy, second trimester: Secondary | ICD-10-CM

## 2018-07-29 DIAGNOSIS — I10 Essential (primary) hypertension: Secondary | ICD-10-CM

## 2018-07-29 DIAGNOSIS — O0992 Supervision of high risk pregnancy, unspecified, second trimester: Secondary | ICD-10-CM

## 2018-07-29 MED ORDER — PREPLUS 27-1 MG PO TABS: 1.0000 | ORAL_TABLET | Freq: Once | ORAL | 3 refills | Status: AC

## 2018-07-29 MED ORDER — ASPIRIN EC 81 MG PO TBEC: 81.0000 mg | DELAYED_RELEASE_TABLET | Freq: Every day | ORAL | 0 refills | Status: DC

## 2018-07-29 NOTE — Progress Notes (Signed)
Pt presents to complete NOB; needs pap and cultures only. Pt does not want genetic screening.  Pt has questions about EDD.  Pt requests rx for PNV's capsules.  Pt states doctor visits causes BP to elevate.

## 2018-07-29 NOTE — Progress Notes (Signed)
Subjective:   Alicia Gay is a 27 y.o. G2P1001 at [redacted]w[redacted]d by LMP being seen today for her first obstetrical visit.  Her obstetrical history is significant for [facility]. Patient does intend to breast feed. Pregnancy history fully reviewed.  Patient reports no complaints, but expresses concern regarding dating stating her periods are irregular. However, she does confirm that her LMP was of normal duration and flow.   HISTORY: OB History  Gravida Para Term Preterm AB Living  2 1 1  0 0 1  SAB TAB Ectopic Multiple Live Births  0 0 0 0 1    # Outcome Date GA Lbr Len/2nd Weight Sex Delivery Anes PTL Lv  2 Current           1 Term 07/19/16 [redacted]w[redacted]d 06:04 / 00:12 7 lb 10 oz (3.459 kg) M Vag-Spont None  LIV     Name: Alicia Gay     Apgar1: 8  Apgar5: 9    Last pap smear was completed 2017 and was ASCUS/+HPV.  Past Medical History:  Diagnosis Date  . Diabetes mellitus without complication (HCC)   . Gestational diabetes   . Hypertension    Past Surgical History:  Procedure Laterality Date  . NO PAST SURGERIES     Family History  Problem Relation Age of Onset  . Cancer Maternal Aunt   . Diabetes Paternal Grandmother    Social History   Tobacco Use  . Smoking status: Never Smoker  . Smokeless tobacco: Never Used  Substance Use Topics  . Alcohol use: No    Comment: prior to preg  . Drug use: No   No Known Allergies Current Outpatient Medications on File Prior to Visit  Medication Sig Dispense Refill  . Prenat w/o A-FeCbn-Meth-FA-DHA (PRENATE MINI) 29-0.6-0.4-350 MG CAPS Take 1 capsule by mouth daily before breakfast. 90 capsule 3  . ibuprofen (ADVIL,MOTRIN) 600 MG tablet Take 1 tablet (600 mg total) by mouth every 6 (six) hours. (Patient not taking: Reported on 08/22/2016) 30 tablet 0  . norethindrone (CAMILA) 0.35 MG tablet Take 1 tablet (0.35 mg total) by mouth daily. (Patient not taking: Reported on 08/22/2016) 1 Package 11  . sodium chloride (OCEAN) 0.65 % SOLN nasal  spray Place 1 spray into both nostrils as needed for congestion.     No current facility-administered medications on file prior to visit.     Review of Systems Pertinent items noted in HPI and remainder of comprehensive ROS otherwise negative.  Exam   Vitals:   07/29/18 1538  BP: 137/88  Pulse: 78  Weight: 205 lb (93 kg)   Fetal Heart Rate (bpm): 148  Uterus:   S>D, ~25cm fundal height  Pelvic Exam: Perineum: no hemorrhoids, normal perineum   Vulva: normal external genitalia, no lesions. Inflammation noted.    Vagina:  Inflammation of mucosa noted, normal discharge   Cervix: no lesions and normal, Moderate amt thick yellow mucoid discharge from os. Pap smear and GC/CT collected.    Adnexa: UTA d/t GA   Bony Pelvis: average  System: General: well-developed, well-nourished female in no acute distress   Breasts:  normal appearance, no masses or tenderness bilaterally   Skin: normal coloration and turgor, no rashes   Neurologic: oriented, normal, negative, normal mood   Extremities: normal strength, tone, and muscle mass, ROM of all joints is normal   HEENT PERRLA, extraocular movement intact and sclera clear, anicteric   Mouth/Teeth mucous membranes moist, pharynx normal without lesions and dental hygiene good  Neck supple and no masses   Cardiovascular: regular rate and rhythm   Respiratory:  no respiratory distress, normal breath sounds   Abdomen: soft, non-tender; bowel sounds normal; no masses,  no organomegaly    Assessment:   Pregnancy: G2P1001 Patient Active Problem List   Diagnosis Date Noted  . Supervision of other normal pregnancy, antepartum 06/25/2018  . Chronic hypertension 07/16/2016  . Gestational diabetes 06/04/2016  . Elevated blood pressure affecting pregnancy in second trimester, antepartum 04/04/2016  . Supervision of high-risk pregnancy 02/02/2016     Plan:  1. Supervision of high risk pregnancy, antepartum Routine care Anticipatory Guidance  given -Rx for PNV - Cytology - PAP( [place]) - Cervicovaginal ancillary only( [place]) - US MFM OB COMP + 14 WK; Future  2. Chronic Hypertension  Discussed diagnosis and current recommendations for management. -Rx for Aspirin 81 mg   Initial labs drawn. Continue prenatal vitamins. Genetic Screening discussed, , First trimester screen, Quad screen and NIPS: declined. Ultrasound discussed; fetal anatomic survey: ordered. Problem list reviewed and updated. The [facility] with multiple MDs and other Advanced Practice Providers was explained to patient; also emphasized that residents, students are part of our team. Routine obstetric precautions reviewed. Return in about 4 weeks (around 08/26/2018) for ROB.   Gerrit HeckJessica Emly, CNM 07/29/2018

## 2018-07-30 LAB — CYTOLOGY - PAP: Diagnosis: NEGATIVE

## 2018-07-31 LAB — CERVICOVAGINAL ANCILLARY ONLY
Bacterial vaginitis: NEGATIVE
CANDIDA VAGINITIS: NEGATIVE
Chlamydia: NEGATIVE
NEISSERIA GONORRHEA: NEGATIVE
TRICH (WINDOWPATH): NEGATIVE

## 2018-08-05 ENCOUNTER — Encounter (HOSPITAL_COMMUNITY): Payer: Self-pay

## 2018-08-10 ENCOUNTER — Other Ambulatory Visit: Payer: Self-pay | Admitting: *Deleted

## 2018-08-10 DIAGNOSIS — O0990 Supervision of high risk pregnancy, unspecified, unspecified trimester: Secondary | ICD-10-CM

## 2018-08-10 NOTE — Progress Notes (Signed)
Change in u/s order to reflect high risk status. OB detail +14 ordered today.

## 2018-08-14 ENCOUNTER — Ambulatory Visit (HOSPITAL_COMMUNITY)
Admission: RE | Admit: 2018-08-14 | Discharge: 2018-08-14 | Disposition: A | Discharge status: Home / Self Care | Payer: Medicaid Other | Source: Ambulatory Visit | Attending: Certified Nurse Midwife | Admitting: Certified Nurse Midwife

## 2018-08-14 ENCOUNTER — Encounter (HOSPITAL_COMMUNITY): Payer: Self-pay

## 2018-08-14 ENCOUNTER — Other Ambulatory Visit (HOSPITAL_COMMUNITY): Payer: Self-pay | Admitting: *Deleted

## 2018-08-14 DIAGNOSIS — O10012 Pre-existing essential hypertension complicating pregnancy, second trimester: Secondary | ICD-10-CM | POA: Diagnosis not present

## 2018-08-14 DIAGNOSIS — O0990 Supervision of high risk pregnancy, unspecified, unspecified trimester: Secondary | ICD-10-CM

## 2018-08-14 DIAGNOSIS — Z3A23 23 weeks gestation of pregnancy: Secondary | ICD-10-CM | POA: Diagnosis not present

## 2018-08-14 DIAGNOSIS — O09292 Supervision of pregnancy with other poor reproductive or obstetric history, second trimester: Secondary | ICD-10-CM | POA: Diagnosis not present

## 2018-08-14 DIAGNOSIS — Z363 Encounter for antenatal screening for malformations: Secondary | ICD-10-CM

## 2018-08-14 DIAGNOSIS — O10919 Unspecified pre-existing hypertension complicating pregnancy, unspecified trimester: Secondary | ICD-10-CM

## 2018-08-14 DIAGNOSIS — Z3689 Encounter for other specified antenatal screening: Secondary | ICD-10-CM

## 2018-08-14 DIAGNOSIS — O0992 Supervision of high risk pregnancy, unspecified, second trimester: Secondary | ICD-10-CM | POA: Insufficient documentation

## 2018-08-19 NOTE — L&D Delivery Note (Signed)
Faculty Note  Called to room for delivery, patient pushing on hands and knees and found to be 10/100/+4. Patient pushed effectively and delivered a viable female infant from OA position under no analgesia. Head delivered, loose nuchal and body cord reduced.  Shoulders and body delivered atraumatically. Infant vigorous from delivery. Nose and mouth bulb suctioned, infant delivered to bed and put into warm blanket, (put onto mom's abdomen as soon as she changed position), cord clamped and cut after one minute.   Cord blood obtained. Placenta delivered spontaneously and intact, 3VC. Minor periurethral lacerations noted, requiring no repair. Weight pending, Apgars 9/9. EBL: 400 mL. IM pitocin given at delivery as IV was not working.   Baldemar Lenis, M.D. Attending Center for [employer] [facility])

## 2018-08-20 ENCOUNTER — Encounter: Payer: Self-pay | Admitting: Certified Nurse Midwife

## 2018-08-26 ENCOUNTER — Encounter: Payer: Self-pay | Admitting: Family Medicine

## 2018-08-26 ENCOUNTER — Encounter: Payer: Medicaid Other | Admitting: Certified Nurse Midwife

## 2018-08-26 DIAGNOSIS — O0990 Supervision of high risk pregnancy, unspecified, unspecified trimester: Secondary | ICD-10-CM

## 2018-08-26 DIAGNOSIS — E669 Obesity, unspecified: Secondary | ICD-10-CM | POA: Insufficient documentation

## 2018-08-26 DIAGNOSIS — O99210 Obesity complicating pregnancy, unspecified trimester: Secondary | ICD-10-CM | POA: Insufficient documentation

## 2018-08-31 ENCOUNTER — Encounter: Payer: Self-pay | Admitting: Obstetrics and Gynecology

## 2018-08-31 ENCOUNTER — Ambulatory Visit (INDEPENDENT_AMBULATORY_CARE_PROVIDER_SITE_OTHER): Payer: Medicaid Other | Admitting: Obstetrics and Gynecology

## 2018-08-31 VITALS — BP 127/83 | HR 75 | Wt 214.0 lb

## 2018-08-31 DIAGNOSIS — O99210 Obesity complicating pregnancy, unspecified trimester: Secondary | ICD-10-CM

## 2018-08-31 DIAGNOSIS — O0990 Supervision of high risk pregnancy, unspecified, unspecified trimester: Secondary | ICD-10-CM

## 2018-08-31 DIAGNOSIS — Z3A26 26 weeks gestation of pregnancy: Secondary | ICD-10-CM

## 2018-08-31 DIAGNOSIS — O0992 Supervision of high risk pregnancy, unspecified, second trimester: Secondary | ICD-10-CM

## 2018-08-31 DIAGNOSIS — I10 Essential (primary) hypertension: Secondary | ICD-10-CM

## 2018-08-31 DIAGNOSIS — O99212 Obesity complicating pregnancy, second trimester: Secondary | ICD-10-CM

## 2018-08-31 NOTE — Progress Notes (Signed)
   PRENATAL VISIT NOTE  Subjective:  Alicia Gay is a 28 y.o. G2P1001 at [redacted]w[redacted]d being seen today for ongoing prenatal care.  She is currently monitored for the following issues for this high-risk pregnancy and has Elevated blood pressure affecting pregnancy in second trimester, antepartum; History of gestational diabetes in prior pregnancy, currently pregnant; Chronic hypertension; Supervision of high risk pregnancy, antepartum; Obesity (BMI 30-39.9); and Obesity affecting pregnancy, antepartum on their problem list.  Patient reports no complaints.  Contractions: Not present. Vag. Bleeding: None.  Movement: Present. Denies leaking of fluid.   The following portions of the patient's history were reviewed and updated as appropriate: allergies, current medications, past family history, past medical history, past social history, past surgical history and problem list. Problem list updated.  Objective:   Vitals:   08/31/18 1614  BP: 127/83  Pulse: 75  Weight: 214 lb (97.1 kg)    Fetal Status: Fetal Heart Rate (bpm): 150 Fundal Height: 27 cm Movement: Present     General:  Alert, oriented and cooperative. Patient is in no acute distress.  Skin: Skin is warm and dry. No rash noted.   Cardiovascular: Normal heart rate noted  Respiratory: Normal respiratory effort, no problems with respiration noted  Abdomen: Soft, gravid, appropriate for gestational age.  Pain/Pressure: Absent     Pelvic: Cervical exam deferred        Extremities: Normal range of motion.  Edema: None  Mental Status: Normal mood and affect. Normal behavior. Normal judgment and thought content.   Assessment and Plan:  Pregnancy: G2P1001 at [redacted]w[redacted]d  1. Supervision of high risk pregnancy, antepartum Patient is doing well without complaints Third trimester labs next visit  2. Chronic hypertension Normotensive without medication Continue ASA Follow up growth ultrasound 1/24  3. Obesity affecting pregnancy,  antepartum   Preterm labor symptoms and general obstetric precautions including but not limited to vaginal bleeding, contractions, leaking of fluid and fetal movement were reviewed in detail with the patient. Please refer to After Visit Summary for other counseling recommendations.  Return in about 2 weeks (around 09/14/2018) for ROB, 2 hr glucola next visit.  Future Appointments  Date Time Provider Department Center  09/11/2018 11:30 AM [facility] [place] 5 [facility]    Catalina Antigua, MD

## 2018-09-11 ENCOUNTER — Encounter (HOSPITAL_COMMUNITY): Payer: Self-pay

## 2018-09-11 ENCOUNTER — Ambulatory Visit (HOSPITAL_COMMUNITY): Payer: Medicaid Other

## 2018-09-11 ENCOUNTER — Other Ambulatory Visit (HOSPITAL_COMMUNITY): Payer: Self-pay | Admitting: *Deleted

## 2018-09-11 ENCOUNTER — Ambulatory Visit (HOSPITAL_COMMUNITY)
Admission: RE | Admit: 2018-09-11 | Discharge: 2018-09-11 | Disposition: A | Discharge status: Home / Self Care | Payer: Medicaid Other | Source: Ambulatory Visit | Attending: Certified Nurse Midwife | Admitting: Certified Nurse Midwife

## 2018-09-11 DIAGNOSIS — Z3A27 27 weeks gestation of pregnancy: Secondary | ICD-10-CM | POA: Diagnosis not present

## 2018-09-11 DIAGNOSIS — Z362 Encounter for other antenatal screening follow-up: Secondary | ICD-10-CM | POA: Diagnosis not present

## 2018-09-11 DIAGNOSIS — O09292 Supervision of pregnancy with other poor reproductive or obstetric history, second trimester: Secondary | ICD-10-CM | POA: Diagnosis not present

## 2018-09-11 DIAGNOSIS — Z3689 Encounter for other specified antenatal screening: Secondary | ICD-10-CM | POA: Diagnosis not present

## 2018-09-11 DIAGNOSIS — O10919 Unspecified pre-existing hypertension complicating pregnancy, unspecified trimester: Secondary | ICD-10-CM | POA: Insufficient documentation

## 2018-09-11 DIAGNOSIS — O10012 Pre-existing essential hypertension complicating pregnancy, second trimester: Secondary | ICD-10-CM

## 2018-09-15 ENCOUNTER — Ambulatory Visit (INDEPENDENT_AMBULATORY_CARE_PROVIDER_SITE_OTHER): Payer: Medicaid Other | Admitting: Obstetrics and Gynecology

## 2018-09-15 ENCOUNTER — Other Ambulatory Visit: Payer: Medicaid Other

## 2018-09-15 ENCOUNTER — Encounter: Payer: Self-pay | Admitting: Obstetrics and Gynecology

## 2018-09-15 VITALS — BP 120/80 | HR 64 | Wt 214.1 lb

## 2018-09-15 DIAGNOSIS — O0993 Supervision of high risk pregnancy, unspecified, third trimester: Secondary | ICD-10-CM

## 2018-09-15 DIAGNOSIS — O10919 Unspecified pre-existing hypertension complicating pregnancy, unspecified trimester: Secondary | ICD-10-CM | POA: Insufficient documentation

## 2018-09-15 DIAGNOSIS — O10913 Unspecified pre-existing hypertension complicating pregnancy, third trimester: Secondary | ICD-10-CM

## 2018-09-15 DIAGNOSIS — Z8632 Personal history of gestational diabetes: Secondary | ICD-10-CM

## 2018-09-15 DIAGNOSIS — O09299 Supervision of pregnancy with other poor reproductive or obstetric history, unspecified trimester: Secondary | ICD-10-CM

## 2018-09-15 DIAGNOSIS — O09293 Supervision of pregnancy with other poor reproductive or obstetric history, third trimester: Secondary | ICD-10-CM

## 2018-09-15 DIAGNOSIS — O0990 Supervision of high risk pregnancy, unspecified, unspecified trimester: Secondary | ICD-10-CM | POA: Diagnosis not present

## 2018-09-15 NOTE — Progress Notes (Signed)
Pt presents for ROB and 2 gtt labs. TDap offered; pt declined.

## 2018-09-15 NOTE — Progress Notes (Signed)
Subjective:  Alicia Gay is a 28 y.o. G2P1001 at [redacted]w[redacted]d being seen today for ongoing prenatal care.  She is currently monitored for the following issues for this high-risk pregnancy and has History of gestational diabetes in prior pregnancy, currently pregnant; Chronic hypertension; Supervision of high risk pregnancy, antepartum; Obesity (BMI 30-39.9); Obesity affecting pregnancy, antepartum; and Chronic hypertension affecting pregnancy on their problem list.  Patient reports no complaints.  Contractions: Not present. Vag. Bleeding: None.  Movement: Present. Denies leaking of fluid.   The following portions of the patient's history were reviewed and updated as appropriate: allergies, current medications, past family history, past medical history, past social history, past surgical history and problem list. Problem list updated.  Objective:   Vitals:   09/15/18 0914  BP: 120/80  Pulse: 64  Weight: 214 lb 1.6 oz (97.1 kg)    Fetal Status:     Movement: Present     General:  Alert, oriented and cooperative. Patient is in no acute distress.  Skin: Skin is warm and dry. No rash noted.   Cardiovascular: Normal heart rate noted  Respiratory: Normal respiratory effort, no problems with respiration noted  Abdomen: Soft, gravid, appropriate for gestational age. Pain/Pressure: Absent     Pelvic:  Cervical exam deferred        Extremities: Normal range of motion.  Edema: None  Mental Status: Normal mood and affect. Normal behavior. Normal judgment and thought content.   Urinalysis:      Assessment and Plan:  Pregnancy: G2P1001 at [redacted]w[redacted]d  1. Supervision of high risk pregnancy, antepartum Stable - HIV Antibody (routine testing w rflx) - RPR - CBC - Glucose Tolerance, 2 Hours w/1 Hour  2. Chronic hypertension affecting pregnancy BP stable without meds No S/Sx of PEC Continue with qd BASA Growth scan f/u ordered  3. History of gestational diabetes in prior pregnancy, currently  pregnant Glucola today  Preterm labor symptoms and general obstetric precautions including but not limited to vaginal bleeding, contractions, leaking of fluid and fetal movement were reviewed in detail with the patient. Please refer to After Visit Summary for other counseling recommendations.  Return in about 2 weeks (around 09/29/2018) for OB visit.   Hermina Staggers, MD

## 2018-09-15 NOTE — Patient Instructions (Signed)
Third Trimester of Pregnancy The third trimester is from week 28 through week 40 (months 7 through 9). The third trimester is a time when the unborn baby (fetus) is growing rapidly. At the end of the ninth month, the fetus is about 20 inches in length and weighs 6-10 pounds. Body changes during your third trimester Your body will continue to go through many changes during pregnancy. The changes vary from woman to woman. During the third trimester:  Your weight will continue to increase. You can expect to gain 25-35 pounds (11-16 kg) by the end of the pregnancy.  You may begin to get stretch marks on your hips, abdomen, and breasts.  You may urinate more often because the fetus is moving lower into your pelvis and pressing on your bladder.  You may develop or continue to have heartburn. This is caused by increased hormones that slow down muscles in the digestive tract.  You may develop or continue to have constipation because increased hormones slow digestion and cause the muscles that push waste through your intestines to relax.  You may develop hemorrhoids. These are swollen veins (varicose veins) in the rectum that can itch or be painful.  You may develop swollen, bulging veins (varicose veins) in your legs.  You may have increased body aches in the pelvis, back, or thighs. This is due to weight gain and increased hormones that are relaxing your joints.  You may have changes in your hair. These can include thickening of your hair, rapid growth, and changes in texture. Some women also have hair loss during or after pregnancy, or hair that feels dry or thin. Your hair will most likely return to normal after your baby is born.  Your breasts will continue to grow and they will continue to become tender. A yellow fluid (colostrum) may leak from your breasts. This is the first milk you are producing for your baby.  Your belly button may stick out.  You may notice more swelling in your hands,  face, or ankles.  You may have increased tingling or numbness in your hands, arms, and legs. The skin on your belly may also feel numb.  You may feel short of breath because of your expanding uterus.  You may have more problems sleeping. This can be caused by the size of your belly, increased need to urinate, and an increase in your body's metabolism.  You may notice the fetus "dropping," or moving lower in your abdomen (lightening).  You may have increased vaginal discharge.  You may notice your joints feel loose and you may have pain around your pelvic bone. What to expect at prenatal visits You will have prenatal exams every 2 weeks until week 36. Then you will have weekly prenatal exams. During a routine prenatal visit:  You will be weighed to make sure you and the baby are growing normally.  Your blood pressure will be taken.  Your abdomen will be measured to track your baby's growth.  The fetal heartbeat will be listened to.  Any test results from the previous visit will be discussed.  You may have a cervical check near your due date to see if your cervix has softened or thinned (effaced).  You will be tested for Group B streptococcus. This happens between 35 and 37 weeks. Your health care provider may ask you:  What your birth plan is.  How you are feeling.  If you are feeling the baby move.  If you have had any abnormal   symptoms, such as leaking fluid, bleeding, severe headaches, or abdominal cramping.  If you are using any tobacco products, including cigarettes, chewing tobacco, and electronic cigarettes.  If you have any questions. Other tests or screenings that may be performed during your third trimester include:  Blood tests that check for low iron levels (anemia).  Fetal testing to check the health, activity level, and growth of the fetus. Testing is done if you have certain medical conditions or if there are problems during the pregnancy.  Nonstress test  (NST). This test checks the health of your baby to make sure there are no signs of problems, such as the baby not getting enough oxygen. During this test, a belt is placed around your belly. The baby is made to move, and its heart rate is monitored during movement. What is false labor? False labor is a condition in which you feel small, irregular tightenings of the muscles in the womb (contractions) that usually go away with rest, changing position, or drinking water. These are called Braxton Hicks contractions. Contractions may last for hours, days, or even weeks before true labor sets in. If contractions come at regular intervals, become more frequent, increase in intensity, or become painful, you should see your health care provider. What are the signs of labor?  Abdominal cramps.  Regular contractions that start at 10 minutes apart and become stronger and more frequent with time.  Contractions that start on the top of the uterus and spread down to the lower abdomen and back.  Increased pelvic pressure and dull back pain.  A watery or bloody mucus discharge that comes from the vagina.  Leaking of amniotic fluid. This is also known as your "water breaking." It could be a slow trickle or a gush. Let your health care provider know if it has a color or strange odor. If you have any of these signs, call your health care provider right away, even if it is before your due date. Follow these instructions at home: Medicines  Follow your health care provider's instructions regarding medicine use. Specific medicines may be either safe or unsafe to take during pregnancy.  Take a prenatal vitamin that contains at least 600 micrograms (mcg) of folic acid.  If you develop constipation, try taking a stool softener if your health care provider approves. Eating and drinking   Eat a balanced diet that includes fresh fruits and vegetables, whole grains, good sources of protein such as meat, eggs, or tofu,  and low-fat dairy. Your health care provider will help you determine the amount of weight gain that is right for you.  Avoid raw meat and uncooked cheese. These carry germs that can cause birth defects in the baby.  If you have low calcium intake from food, talk to your health care provider about whether you should take a daily calcium supplement.  Eat four or five small meals rather than three large meals a day.  Limit foods that are high in fat and processed sugars, such as fried and sweet foods.  To prevent constipation: ? Drink enough fluid to keep your urine clear or pale yellow. ? Eat foods that are high in fiber, such as fresh fruits and vegetables, whole grains, and beans. Activity  Exercise only as directed by your health care provider. Most women can continue their usual exercise routine during pregnancy. Try to exercise for 30 minutes at least 5 days a week. Stop exercising if you experience uterine contractions.  Avoid heavy lifting.  Do   not exercise in extreme heat or humidity, or at high altitudes.  Wear low-heel, comfortable shoes.  Practice good posture.  You may continue to have sex unless your health care provider tells you otherwise. Relieving pain and discomfort  Take frequent breaks and rest with your legs elevated if you have leg cramps or low back pain.  Take warm sitz baths to soothe any pain or discomfort caused by hemorrhoids. Use hemorrhoid cream if your health care provider approves.  Wear a good support bra to prevent discomfort from breast tenderness.  If you develop varicose veins: ? Wear support pantyhose or compression stockings as told by your healthcare provider. ? Elevate your feet for 15 minutes, 3-4 times a day. Prenatal care  Write down your questions. Take them to your prenatal visits.  Keep all your prenatal visits as told by your health care provider. This is important. Safety  Wear your seat belt at all times when driving.  Make  a list of emergency phone numbers, including numbers for family, friends, the hospital, and police and fire departments. General instructions  Avoid cat litter boxes and soil used by cats. These carry germs that can cause birth defects in the baby. If you have a cat, ask someone to clean the litter box for you.  Do not travel far distances unless it is absolutely necessary and only with the approval of your health care provider.  Do not use hot tubs, steam rooms, or saunas.  Do not drink alcohol.  Do not use any products that contain nicotine or tobacco, such as cigarettes and e-cigarettes. If you need help quitting, ask your health care provider.  Do not use any medicinal herbs or unprescribed drugs. These chemicals affect the formation and growth of the baby.  Do not douche or use tampons or scented sanitary pads.  Do not cross your legs for long periods of time.  To prepare for the arrival of your baby: ? Take prenatal classes to understand, practice, and ask questions about labor and delivery. ? Make a trial run to the hospital. ? Visit the hospital and tour the maternity area. ? Arrange for maternity or paternity leave through employers. ? Arrange for family and friends to take care of pets while you are in the hospital. ? Purchase a rear-facing car seat and make sure you know how to install it in your car. ? Pack your hospital bag. ? Prepare the baby's nursery. Make sure to remove all pillows and stuffed animals from the baby's crib to prevent suffocation.  Visit your dentist if you have not gone during your pregnancy. Use a soft toothbrush to brush your teeth and be gentle when you floss. Contact a health care provider if:  You are unsure if you are in labor or if your water has broken.  You become dizzy.  You have mild pelvic cramps, pelvic pressure, or nagging pain in your abdominal area.  You have lower back pain.  You have persistent nausea, vomiting, or  diarrhea.  You have an unusual or bad smelling vaginal discharge.  You have pain when you urinate. Get help right away if:  Your water breaks before 37 weeks.  You have regular contractions less than 5 minutes apart before 37 weeks.  You have a fever.  You are leaking fluid from your vagina.  You have spotting or bleeding from your vagina.  You have severe abdominal pain or cramping.  You have rapid weight loss or weight gain.  You have   shortness of breath with chest pain.  You notice sudden or extreme swelling of your face, hands, ankles, feet, or legs.  Your baby makes fewer than 10 movements in 2 hours.  You have severe headaches that do not go away when you take medicine.  You have vision changes. Summary  The third trimester is from week 28 through week 40, months 7 through 9. The third trimester is a time when the unborn baby (fetus) is growing rapidly.  During the third trimester, your discomfort may increase as you and your baby continue to gain weight. You may have abdominal, leg, and back pain, sleeping problems, and an increased need to urinate.  During the third trimester your breasts will keep growing and they will continue to become tender. A yellow fluid (colostrum) may leak from your breasts. This is the first milk you are producing for your baby.  False labor is a condition in which you feel small, irregular tightenings of the muscles in the womb (contractions) that eventually go away. These are called Braxton Hicks contractions. Contractions may last for hours, days, or even weeks before true labor sets in.  Signs of labor can include: abdominal cramps; regular contractions that start at 10 minutes apart and become stronger and more frequent with time; watery or bloody mucus discharge that comes from the vagina; increased pelvic pressure and dull back pain; and leaking of amniotic fluid. This information is not intended to replace advice given to you by your  health care provider. Make sure you discuss any questions you have with your health care provider. Document Released: 07/30/2001 Document Revised: 09/10/2016 Document Reviewed: 09/10/2016 Elsevier Interactive Patient Education  2019 [employer]  

## 2018-09-16 LAB — CBC
Hematocrit: 36.3 % (ref 34.0–46.6)
Hemoglobin: 11.8 g/dL (ref 11.1–15.9)
MCH: 26.9 pg (ref 26.6–33.0)
MCHC: 32.5 g/dL (ref 31.5–35.7)
MCV: 83 fL (ref 79–97)
PLATELETS: 239 10*3/uL (ref 150–450)
RBC: 4.39 x10E6/uL (ref 3.77–5.28)
RDW: 12.8 % (ref 11.7–15.4)
WBC: 7.2 10*3/uL (ref 3.4–10.8)

## 2018-09-16 LAB — GLUCOSE TOLERANCE, 2 HOURS W/ 1HR
GLUCOSE, 1 HOUR: 105 mg/dL (ref 65–179)
GLUCOSE, FASTING: 69 mg/dL (ref 65–91)
Glucose, 2 hour: 95 mg/dL (ref 65–152)

## 2018-09-16 LAB — RPR: RPR: NONREACTIVE

## 2018-09-16 LAB — HIV ANTIBODY (ROUTINE TESTING W REFLEX): HIV SCREEN 4TH GENERATION: NONREACTIVE

## 2018-09-29 ENCOUNTER — Other Ambulatory Visit: Payer: Self-pay

## 2018-09-29 ENCOUNTER — Ambulatory Visit (INDEPENDENT_AMBULATORY_CARE_PROVIDER_SITE_OTHER): Payer: Medicaid Other | Admitting: Obstetrics & Gynecology

## 2018-09-29 VITALS — BP 127/81 | HR 86 | Wt 214.0 lb

## 2018-09-29 DIAGNOSIS — O0990 Supervision of high risk pregnancy, unspecified, unspecified trimester: Secondary | ICD-10-CM

## 2018-09-29 DIAGNOSIS — O10913 Unspecified pre-existing hypertension complicating pregnancy, third trimester: Secondary | ICD-10-CM

## 2018-09-29 DIAGNOSIS — O10919 Unspecified pre-existing hypertension complicating pregnancy, unspecified trimester: Secondary | ICD-10-CM

## 2018-09-29 DIAGNOSIS — O0993 Supervision of high risk pregnancy, unspecified, third trimester: Secondary | ICD-10-CM

## 2018-09-29 NOTE — Progress Notes (Signed)
   PRENATAL VISIT NOTE  Subjective:  Alicia Gay is a 28 y.o. G2P1001 at [redacted]w[redacted]d being seen today for ongoing prenatal care.  She is currently monitored for the following issues for this high-risk pregnancy and has History of gestational diabetes in prior pregnancy, currently pregnant; Chronic hypertension; Supervision of high risk pregnancy, antepartum; Obesity (BMI 30-39.9); Obesity affecting pregnancy, antepartum; and Chronic hypertension affecting pregnancy on their problem list.  Patient reports no complaints.  Contractions: Not present. Vag. Bleeding: None.  Movement: Present. Denies leaking of fluid.   The following portions of the patient's history were reviewed and updated as appropriate: allergies, current medications, past family history, past medical history, past social history, past surgical history and problem list. Problem list updated.  Objective:   Vitals:   09/29/18 1338  BP: 127/81  Pulse: 86  Weight: 214 lb (97.1 kg)    Fetal Status: Fetal Heart Rate (bpm): 143   Movement: Present     General:  Alert, oriented and cooperative. Patient is in no acute distress.  Skin: Skin is warm and dry. No rash noted.   Cardiovascular: Normal heart rate noted  Respiratory: Normal respiratory effort, no problems with respiration noted  Abdomen: Soft, gravid, appropriate for gestational age.  Pain/Pressure: Absent     Pelvic: Cervical exam deferred        Extremities: Normal range of motion.  Edema: None  Mental Status: Normal mood and affect. Normal behavior. Normal judgment and thought content.   Assessment and Plan:  Pregnancy: G2P1001 at [redacted]w[redacted]d  1. Chronic hypertension affecting pregnancy normotensive  2. Supervision of high risk pregnancy, antepartum F/u US next week  Preterm labor symptoms and general obstetric precautions including but not limited to vaginal bleeding, contractions, leaking of fluid and fetal movement were reviewed in detail with the patient. Please  refer to After Visit Summary for other counseling recommendations.  Return in about 2 weeks (around 10/13/2018).  Future Appointments  Date Time Provider Department Center  10/09/2018  2:15 PM [facility] [place] [facility]    Scheryl Darter, MD

## 2018-09-29 NOTE — Patient Instructions (Signed)

## 2018-10-05 IMAGING — US US MFM OB FOLLOW-UP
1 series · 14 of 28 positions shown · non-contrast
Comparison: none

[Series 1: us mfm ob follow-up · 45 acquisitions, 14 frames shown]
[im 2/45]
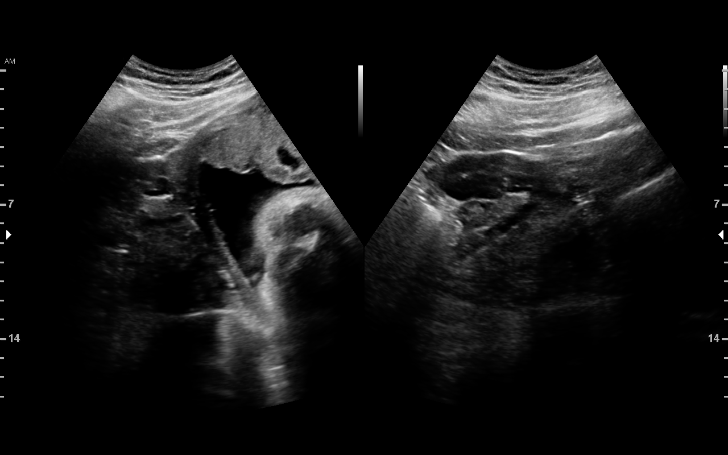
[im 5/45]
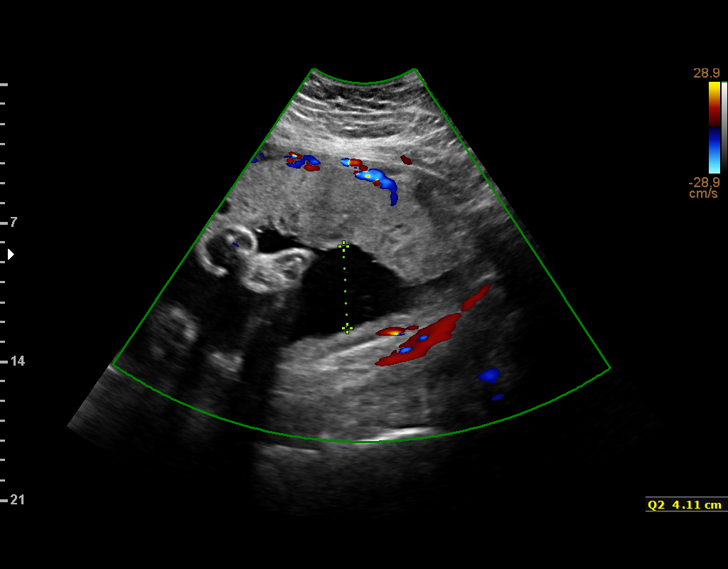
[im 9/45]
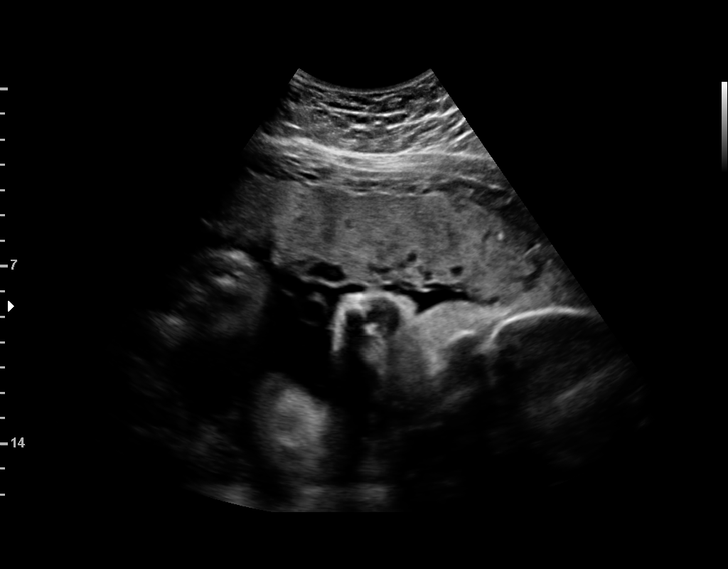
[im 12/45]
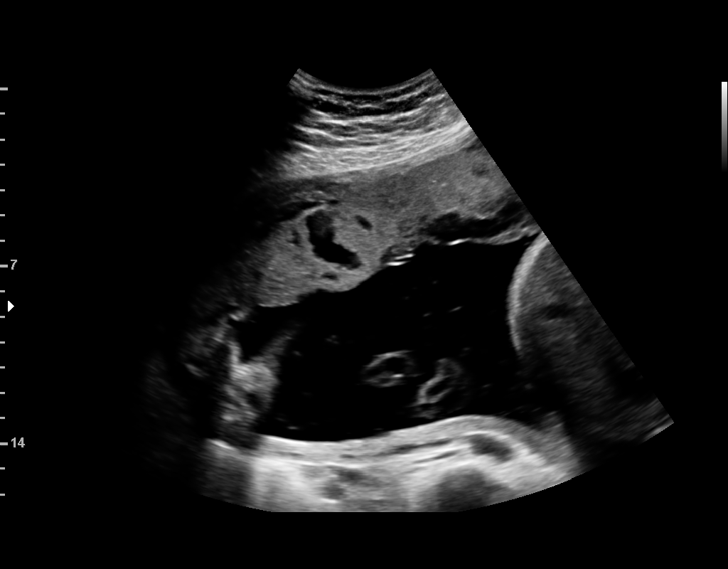
[im 15/45]
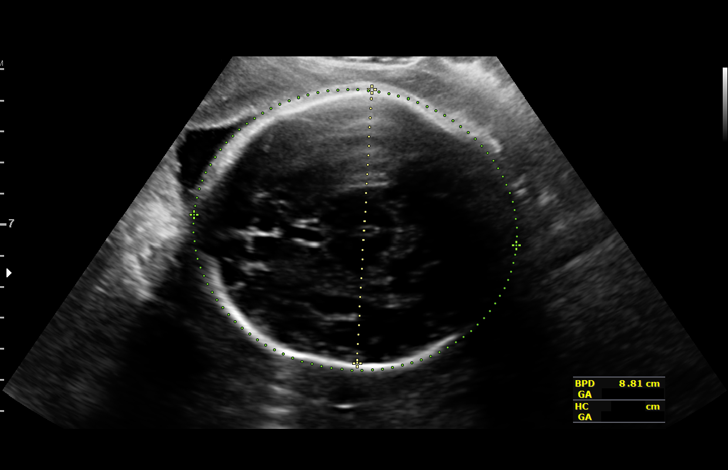
[im 18/45]
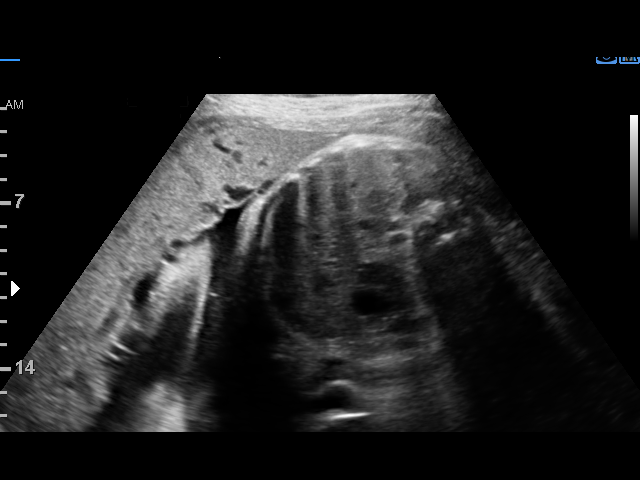
[im 22/45]
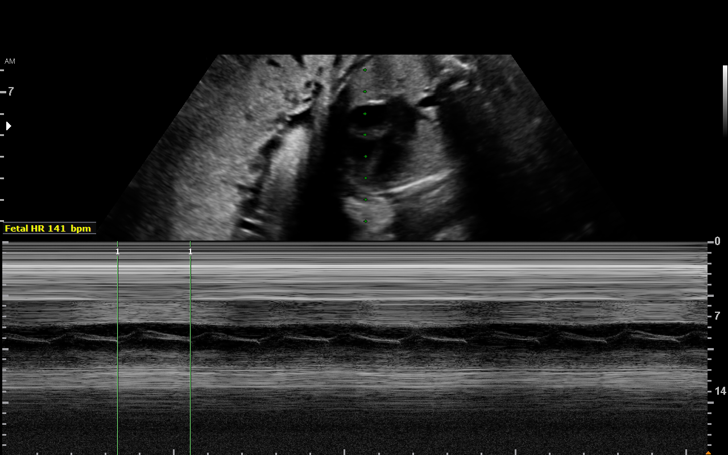
[im 25/45]
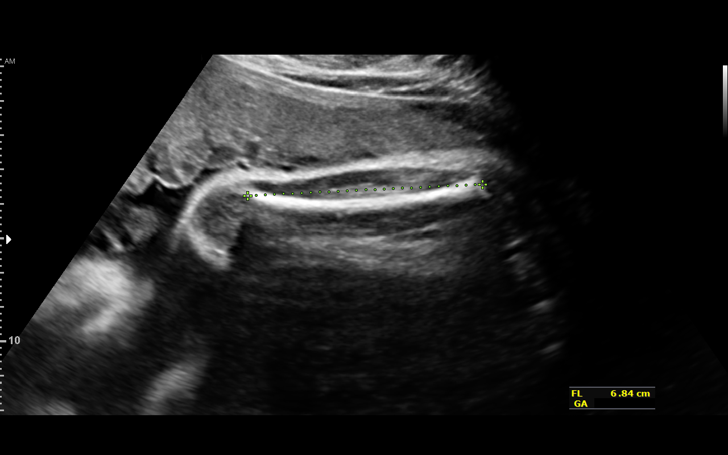
[im 28/45]
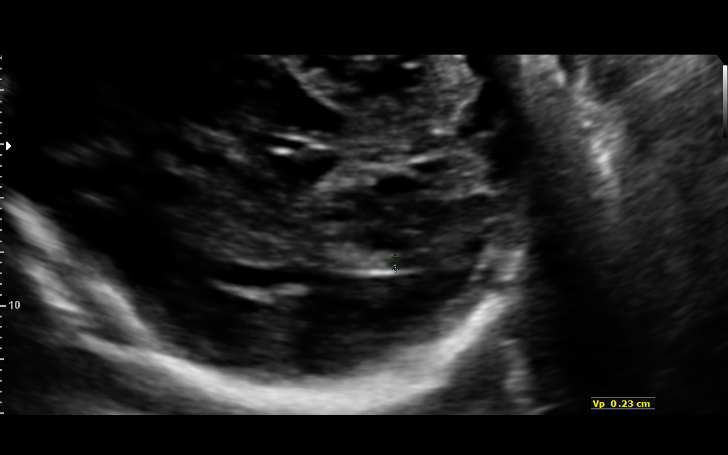
[im 31/45]
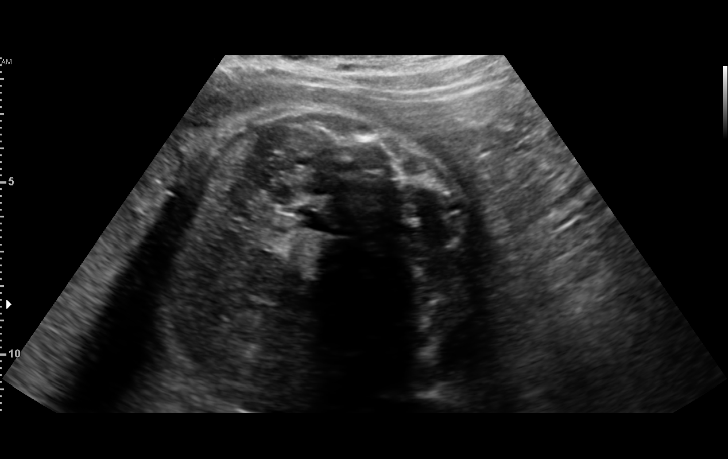
[im 35/45]
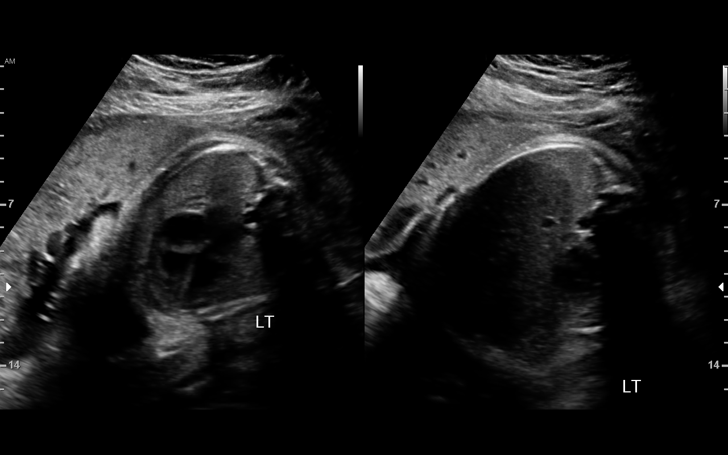
[im 38/45]
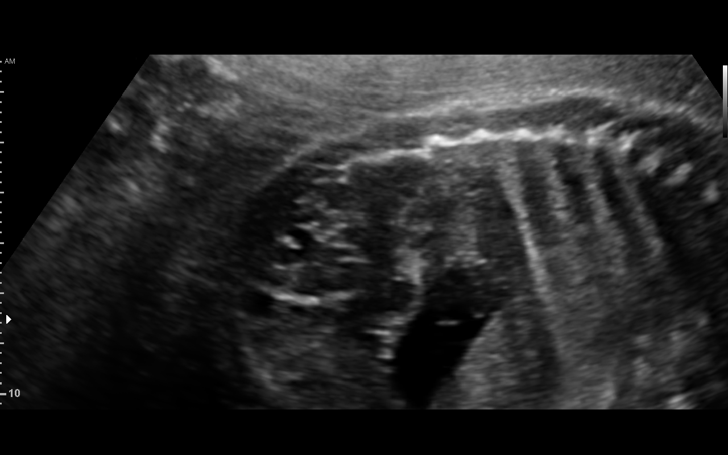
[im 41/45]
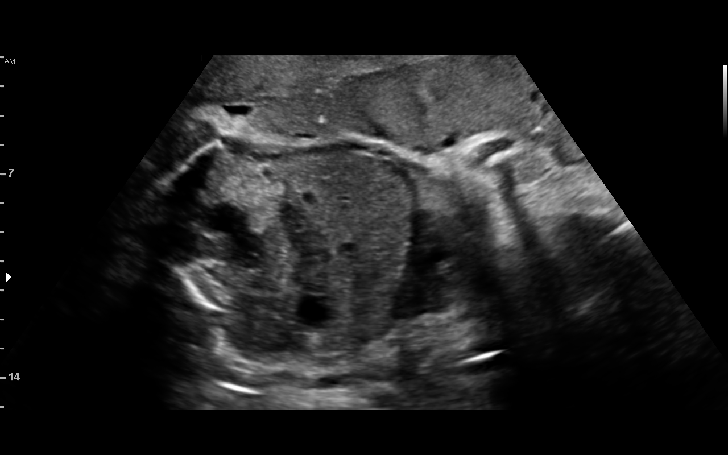
[im 45/45]
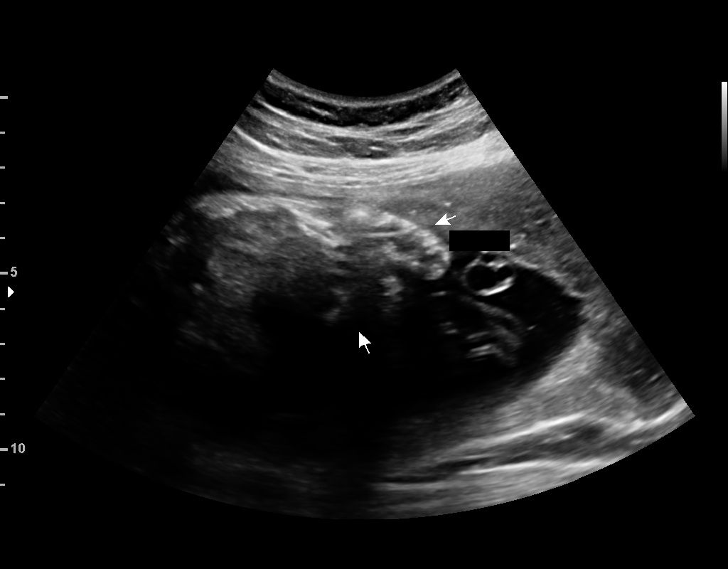

[14 of 28 positions shown; findings below may reference images not displayed]

Road [HOSPITAL]

1  [facility]UMPER           [id number]      [id number]     [id number]
Indications

34 weeks gestation of pregnancy
Hypertension - Chronic/Pre-existing; not
taking meds b/c BP normal at home
OB History

Blood Type:            Height:  5'7"   Weight (lb):  203       BMI:
Gravidity:    1
Fetal Evaluation

Num Of Fetuses:     1
Fetal Heart         141
Rate(bpm):
Cardiac Activity:   Observed
Presentation:       Cephalic
Placenta:           Anterior, above cervical os
P. Cord Insertion:  Visualized, central

Amniotic Fluid
AFI FV:      Subjectively within normal limits

AFI Sum(cm)     %Tile       Largest Pocket(cm)
14.08           49

RUQ(cm)       RLQ(cm)       LUQ(cm)        LLQ(cm)
6.51
Biometry

BPD:      87.3  mm     G. Age:  35w 2d         81  %    CI:         80.13  %    70 - 86
FL/HC:       21.6  %    19.4 -
HC:      308.1  mm     G. Age:  34w 3d         24  %    HC/AC:       0.97       0.96 -
AC:      316.9  mm     G. Age:  35w 4d         91  %    FL/BPD:      76.1  %    71 - 87
FL:       66.4  mm     G. Age:  34w 1d         45  %    FL/AC:       21.0  %    20 - 24

Est. FW:    4010   gm    5 lb 11 oz     78  %
Gestational Age

LMP:           35w 4d        Date:  09/28/15                 EDD:    07/04/16
U/S Today:     34w 6d                                        EDD:    07/09/16
Best:          34w 0d     Det. By:  Early Ultrasound         EDD:    07/15/16
(01/19/16)
Anatomy

Cranium:               Appears normal         Aortic Arch:            Previously seen
Cavum:                 Appears normal         Ductal Arch:            Previously seen
Ventricles:            Appears normal         Diaphragm:              Appears normal
Choroid Plexus:        Appears normal         Stomach:                Appears normal, left
sided
Cerebellum:            Appears normal         Abdomen:                Appears normal
Posterior Fossa:       Appears normal         Abdominal Wall:         Previously seen
Nuchal Fold:           Not applicable (>20    Cord Vessels:           Previously seen
wks GA)
Face:                  Orbits and profile     Kidneys:                Appear normal
previously seen
Lips:                  Appears normal         Bladder:                Appears normal
Thoracic:              Appears normal         Spine:                  Previously seen
Heart:                 Appears normal         Upper Extremities:      Previously seen
(4CH, axis, and situs
RVOT:                  Previously seen        Lower Extremities:      Previously seen
LVOT:                  Previously seen

Other:  Male gender. Heels previously visualized. Nasal bone visualized.
Cervix Uterus Adnexa

Cervix
Not visualized (advanced GA >47wks)

Uterus
No abnormality visualized.

Left Ovary
Not visualized.

Right Ovary
Not visualized.

Adnexa:       No abnormality visualized.
Impression

SIUP at 34+0 weeks
Normal interval anatomy; anatomic survey complete
Normal amniotic fluid volume
Appropriate interval growth with EFW at the 78th %tile
Recommendations

Suggest antenatal testing (even though pt denying cHTN)

## 2018-10-09 ENCOUNTER — Encounter (HOSPITAL_COMMUNITY): Payer: Self-pay

## 2018-10-09 ENCOUNTER — Ambulatory Visit (HOSPITAL_COMMUNITY)
Admission: RE | Admit: 2018-10-09 | Discharge: 2018-10-09 | Disposition: A | Discharge status: Home / Self Care | Payer: Medicaid Other | Source: Ambulatory Visit | Attending: Obstetrics and Gynecology | Admitting: Obstetrics and Gynecology

## 2018-10-09 DIAGNOSIS — O10019 Pre-existing essential hypertension complicating pregnancy, unspecified trimester: Secondary | ICD-10-CM

## 2018-10-09 DIAGNOSIS — O0990 Supervision of high risk pregnancy, unspecified, unspecified trimester: Secondary | ICD-10-CM | POA: Insufficient documentation

## 2018-10-09 DIAGNOSIS — Z3A31 31 weeks gestation of pregnancy: Secondary | ICD-10-CM | POA: Diagnosis not present

## 2018-10-09 DIAGNOSIS — O99210 Obesity complicating pregnancy, unspecified trimester: Secondary | ICD-10-CM | POA: Insufficient documentation

## 2018-10-09 DIAGNOSIS — O09299 Supervision of pregnancy with other poor reproductive or obstetric history, unspecified trimester: Secondary | ICD-10-CM

## 2018-10-09 DIAGNOSIS — Z362 Encounter for other antenatal screening follow-up: Secondary | ICD-10-CM | POA: Diagnosis not present

## 2018-10-09 DIAGNOSIS — O10919 Unspecified pre-existing hypertension complicating pregnancy, unspecified trimester: Secondary | ICD-10-CM | POA: Insufficient documentation

## 2018-10-13 ENCOUNTER — Other Ambulatory Visit (HOSPITAL_COMMUNITY): Payer: Self-pay | Admitting: *Deleted

## 2018-10-13 DIAGNOSIS — O10913 Unspecified pre-existing hypertension complicating pregnancy, third trimester: Secondary | ICD-10-CM

## 2018-10-14 ENCOUNTER — Other Ambulatory Visit (HOSPITAL_COMMUNITY): Payer: Self-pay | Admitting: *Deleted

## 2018-10-14 ENCOUNTER — Ambulatory Visit (INDEPENDENT_AMBULATORY_CARE_PROVIDER_SITE_OTHER): Payer: Medicaid Other | Admitting: Obstetrics and Gynecology

## 2018-10-14 ENCOUNTER — Encounter: Payer: Self-pay | Admitting: Obstetrics and Gynecology

## 2018-10-14 VITALS — BP 127/83 | HR 97 | Wt 219.0 lb

## 2018-10-14 DIAGNOSIS — Z3A32 32 weeks gestation of pregnancy: Secondary | ICD-10-CM

## 2018-10-14 DIAGNOSIS — O0993 Supervision of high risk pregnancy, unspecified, third trimester: Secondary | ICD-10-CM

## 2018-10-14 DIAGNOSIS — O10913 Unspecified pre-existing hypertension complicating pregnancy, third trimester: Secondary | ICD-10-CM | POA: Diagnosis not present

## 2018-10-14 DIAGNOSIS — O0990 Supervision of high risk pregnancy, unspecified, unspecified trimester: Secondary | ICD-10-CM

## 2018-10-14 DIAGNOSIS — O10919 Unspecified pre-existing hypertension complicating pregnancy, unspecified trimester: Secondary | ICD-10-CM

## 2018-10-14 DIAGNOSIS — I10 Essential (primary) hypertension: Secondary | ICD-10-CM

## 2018-10-14 NOTE — Patient Instructions (Signed)
Third Trimester of Pregnancy The third trimester is from week 28 through week 40 (months 7 through 9). The third trimester is a time when the unborn baby (fetus) is growing rapidly. At the end of the ninth month, the fetus is about 20 inches in length and weighs 6-10 pounds. Body changes during your third trimester Your body will continue to go through many changes during pregnancy. The changes vary from woman to woman. During the third trimester:  Your weight will continue to increase. You can expect to gain 25-35 pounds (11-16 kg) by the end of the pregnancy.  You may begin to get stretch marks on your hips, abdomen, and breasts.  You may urinate more often because the fetus is moving lower into your pelvis and pressing on your bladder.  You may develop or continue to have heartburn. This is caused by increased hormones that slow down muscles in the digestive tract.  You may develop or continue to have constipation because increased hormones slow digestion and cause the muscles that push waste through your intestines to relax.  You may develop hemorrhoids. These are swollen veins (varicose veins) in the rectum that can itch or be painful.  You may develop swollen, bulging veins (varicose veins) in your legs.  You may have increased body aches in the pelvis, back, or thighs. This is due to weight gain and increased hormones that are relaxing your joints.  You may have changes in your hair. These can include thickening of your hair, rapid growth, and changes in texture. Some women also have hair loss during or after pregnancy, or hair that feels dry or thin. Your hair will most likely return to normal after your baby is born.  Your breasts will continue to grow and they will continue to become tender. A yellow fluid (colostrum) may leak from your breasts. This is the first milk you are producing for your baby.  Your belly button may stick out.  You may notice more swelling in your hands,  face, or ankles.  You may have increased tingling or numbness in your hands, arms, and legs. The skin on your belly may also feel numb.  You may feel short of breath because of your expanding uterus.  You may have more problems sleeping. This can be caused by the size of your belly, increased need to urinate, and an increase in your body's metabolism.  You may notice the fetus "dropping," or moving lower in your abdomen (lightening).  You may have increased vaginal discharge.  You may notice your joints feel loose and you may have pain around your pelvic bone. What to expect at prenatal visits You will have prenatal exams every 2 weeks until week 36. Then you will have weekly prenatal exams. During a routine prenatal visit:  You will be weighed to make sure you and the baby are growing normally.  Your blood pressure will be taken.  Your abdomen will be measured to track your baby's growth.  The fetal heartbeat will be listened to.  Any test results from the previous visit will be discussed.  You may have a cervical check near your due date to see if your cervix has softened or thinned (effaced).  You will be tested for Group B streptococcus. This happens between 35 and 37 weeks. Your health care provider may ask you:  What your birth plan is.  How you are feeling.  If you are feeling the baby move.  If you have had any abnormal   symptoms, such as leaking fluid, bleeding, severe headaches, or abdominal cramping.  If you are using any tobacco products, including cigarettes, chewing tobacco, and electronic cigarettes.  If you have any questions. Other tests or screenings that may be performed during your third trimester include:  Blood tests that check for low iron levels (anemia).  Fetal testing to check the health, activity level, and growth of the fetus. Testing is done if you have certain medical conditions or if there are problems during the pregnancy.  Nonstress test  (NST). This test checks the health of your baby to make sure there are no signs of problems, such as the baby not getting enough oxygen. During this test, a belt is placed around your belly. The baby is made to move, and its heart rate is monitored during movement. What is false labor? False labor is a condition in which you feel small, irregular tightenings of the muscles in the womb (contractions) that usually go away with rest, changing position, or drinking water. These are called Braxton Hicks contractions. Contractions may last for hours, days, or even weeks before true labor sets in. If contractions come at regular intervals, become more frequent, increase in intensity, or become painful, you should see your health care provider. What are the signs of labor?  Abdominal cramps.  Regular contractions that start at 10 minutes apart and become stronger and more frequent with time.  Contractions that start on the top of the uterus and spread down to the lower abdomen and back.  Increased pelvic pressure and dull back pain.  A watery or bloody mucus discharge that comes from the vagina.  Leaking of amniotic fluid. This is also known as your "water breaking." It could be a slow trickle or a gush. Let your health care provider know if it has a color or strange odor. If you have any of these signs, call your health care provider right away, even if it is before your due date. Follow these instructions at home: Medicines  Follow your health care provider's instructions regarding medicine use. Specific medicines may be either safe or unsafe to take during pregnancy.  Take a prenatal vitamin that contains at least 600 micrograms (mcg) of folic acid.  If you develop constipation, try taking a stool softener if your health care provider approves. Eating and drinking   Eat a balanced diet that includes fresh fruits and vegetables, whole grains, good sources of protein such as meat, eggs, or tofu,  and low-fat dairy. Your health care provider will help you determine the amount of weight gain that is right for you.  Avoid raw meat and uncooked cheese. These carry germs that can cause birth defects in the baby.  If you have low calcium intake from food, talk to your health care provider about whether you should take a daily calcium supplement.  Eat four or five small meals rather than three large meals a day.  Limit foods that are high in fat and processed sugars, such as fried and sweet foods.  To prevent constipation: ? Drink enough fluid to keep your urine clear or pale yellow. ? Eat foods that are high in fiber, such as fresh fruits and vegetables, whole grains, and beans. Activity  Exercise only as directed by your health care provider. Most women can continue their usual exercise routine during pregnancy. Try to exercise for 30 minutes at least 5 days a week. Stop exercising if you experience uterine contractions.  Avoid heavy lifting.  Do   not exercise in extreme heat or humidity, or at high altitudes.  Wear low-heel, comfortable shoes.  Practice good posture.  You may continue to have sex unless your health care provider tells you otherwise. Relieving pain and discomfort  Take frequent breaks and rest with your legs elevated if you have leg cramps or low back pain.  Take warm sitz baths to soothe any pain or discomfort caused by hemorrhoids. Use hemorrhoid cream if your health care provider approves.  Wear a good support bra to prevent discomfort from breast tenderness.  If you develop varicose veins: ? Wear support pantyhose or compression stockings as told by your healthcare provider. ? Elevate your feet for 15 minutes, 3-4 times a day. Prenatal care  Write down your questions. Take them to your prenatal visits.  Keep all your prenatal visits as told by your health care provider. This is important. Safety  Wear your seat belt at all times when driving.  Make  a list of emergency phone numbers, including numbers for family, friends, the hospital, and police and fire departments. General instructions  Avoid cat litter boxes and soil used by cats. These carry germs that can cause birth defects in the baby. If you have a cat, ask someone to clean the litter box for you.  Do not travel far distances unless it is absolutely necessary and only with the approval of your health care provider.  Do not use hot tubs, steam rooms, or saunas.  Do not drink alcohol.  Do not use any products that contain nicotine or tobacco, such as cigarettes and e-cigarettes. If you need help quitting, ask your health care provider.  Do not use any medicinal herbs or unprescribed drugs. These chemicals affect the formation and growth of the baby.  Do not douche or use tampons or scented sanitary pads.  Do not cross your legs for long periods of time.  To prepare for the arrival of your baby: ? Take prenatal classes to understand, practice, and ask questions about labor and delivery. ? Make a trial run to the hospital. ? Visit the hospital and tour the maternity area. ? Arrange for maternity or paternity leave through employers. ? Arrange for family and friends to take care of pets while you are in the hospital. ? Purchase a rear-facing car seat and make sure you know how to install it in your car. ? Pack your hospital bag. ? Prepare the baby's nursery. Make sure to remove all pillows and stuffed animals from the baby's crib to prevent suffocation.  Visit your dentist if you have not gone during your pregnancy. Use a soft toothbrush to brush your teeth and be gentle when you floss. Contact a health care provider if:  You are unsure if you are in labor or if your water has broken.  You become dizzy.  You have mild pelvic cramps, pelvic pressure, or nagging pain in your abdominal area.  You have lower back pain.  You have persistent nausea, vomiting, or  diarrhea.  You have an unusual or bad smelling vaginal discharge.  You have pain when you urinate. Get help right away if:  Your water breaks before 37 weeks.  You have regular contractions less than 5 minutes apart before 37 weeks.  You have a fever.  You are leaking fluid from your vagina.  You have spotting or bleeding from your vagina.  You have severe abdominal pain or cramping.  You have rapid weight loss or weight gain.  You have   shortness of breath with chest pain.  You notice sudden or extreme swelling of your face, hands, ankles, feet, or legs.  Your baby makes fewer than 10 movements in 2 hours.  You have severe headaches that do not go away when you take medicine.  You have vision changes. Summary  The third trimester is from week 28 through week 40, months 7 through 9. The third trimester is a time when the unborn baby (fetus) is growing rapidly.  During the third trimester, your discomfort may increase as you and your baby continue to gain weight. You may have abdominal, leg, and back pain, sleeping problems, and an increased need to urinate.  During the third trimester your breasts will keep growing and they will continue to become tender. A yellow fluid (colostrum) may leak from your breasts. This is the first milk you are producing for your baby.  False labor is a condition in which you feel small, irregular tightenings of the muscles in the womb (contractions) that eventually go away. These are called Braxton Hicks contractions. Contractions may last for hours, days, or even weeks before true labor sets in.  Signs of labor can include: abdominal cramps; regular contractions that start at 10 minutes apart and become stronger and more frequent with time; watery or bloody mucus discharge that comes from the vagina; increased pelvic pressure and dull back pain; and leaking of amniotic fluid. This information is not intended to replace advice given to you by your  health care provider. Make sure you discuss any questions you have with your health care provider. Document Released: 07/30/2001 Document Revised: 09/10/2016 Document Reviewed: 09/10/2016 Elsevier Interactive Patient Education  2019 [employer]  

## 2018-10-14 NOTE — Progress Notes (Addendum)
Subjective:  Alicia Gay is a 28 y.o. G2P1001 at [redacted]w[redacted]d being seen today for ongoing prenatal care.  She is currently monitored for the following issues for this high-risk pregnancy and has History of gestational diabetes in prior pregnancy, currently pregnant; Chronic hypertension; Supervision of high risk pregnancy, antepartum; Obesity (BMI 30-39.9); Obesity affecting pregnancy, antepartum; and Chronic hypertension affecting pregnancy on their problem list.  Patient reports no complaints.  Contractions: Not present. Vag. Bleeding: None.  Movement: Present. Denies leaking of fluid.   The following portions of the patient's history were reviewed and updated as appropriate: allergies, current medications, past family history, past medical history, past social history, past surgical history and problem list. Problem list updated.  Objective:   Vitals:   10/14/18 1519  BP: 127/83  Pulse: 97  Weight: 219 lb (99.3 kg)    Fetal Status: Fetal Heart Rate (bpm): NST   Movement: Present     General:  Alert, oriented and cooperative. Patient is in no acute distress.  Skin: Skin is warm and dry. No rash noted.   Cardiovascular: Normal heart rate noted  Respiratory: Normal respiratory effort, no problems with respiration noted  Abdomen: Soft, gravid, appropriate for gestational age. Pain/Pressure: Absent     Pelvic:  Cervical exam deferred        Extremities: Normal range of motion.  Edema: None  Mental Status: Normal mood and affect. Normal behavior. Normal judgment and thought content.   Urinalysis:      Assessment and Plan:  Pregnancy: G2P1001 at [redacted]w[redacted]d  1. Supervision of high risk pregnancy, antepartum Stable   2. Chronic hypertension BP stable without medications Growth scan 10/09/18 Inatvertally scheduled for NST today NST today 140 baseline, 15 x 15 accels Start antenatal testing at 36 weeks as per practice guidelines Antenatal testing reviewed with pt - Fetal nonstress test;  Future  Preterm labor symptoms and general obstetric precautions including but not limited to vaginal bleeding, contractions, leaking of fluid and fetal movement were reviewed in detail with the patient. Please refer to After Visit Summary for other counseling recommendations.  Return in about 2 weeks (around 10/28/2018) for OB visit.   Hermina Staggers, MD

## 2018-10-28 ENCOUNTER — Encounter: Payer: Self-pay | Admitting: Certified Nurse Midwife

## 2018-10-28 ENCOUNTER — Ambulatory Visit (INDEPENDENT_AMBULATORY_CARE_PROVIDER_SITE_OTHER): Payer: Medicaid Other | Admitting: Certified Nurse Midwife

## 2018-10-28 ENCOUNTER — Other Ambulatory Visit: Payer: Self-pay

## 2018-10-28 VITALS — BP 129/90 | HR 92 | Wt 214.1 lb

## 2018-10-28 DIAGNOSIS — O10919 Unspecified pre-existing hypertension complicating pregnancy, unspecified trimester: Secondary | ICD-10-CM

## 2018-10-28 DIAGNOSIS — O10913 Unspecified pre-existing hypertension complicating pregnancy, third trimester: Secondary | ICD-10-CM

## 2018-10-28 DIAGNOSIS — Z3A34 34 weeks gestation of pregnancy: Secondary | ICD-10-CM

## 2018-10-28 DIAGNOSIS — O99210 Obesity complicating pregnancy, unspecified trimester: Secondary | ICD-10-CM

## 2018-10-28 DIAGNOSIS — O0990 Supervision of high risk pregnancy, unspecified, unspecified trimester: Secondary | ICD-10-CM

## 2018-10-28 DIAGNOSIS — O99213 Obesity complicating pregnancy, third trimester: Secondary | ICD-10-CM

## 2018-10-28 NOTE — Progress Notes (Signed)
   PRENATAL VISIT NOTE  Subjective:  Alicia Gay is a 28 y.o. G2P1001 at [redacted]w[redacted]d being seen today for ongoing prenatal care.  She is currently monitored for the following issues for this high-risk pregnancy and has History of gestational diabetes in prior pregnancy, currently pregnant; Chronic hypertension; Supervision of high risk pregnancy, antepartum; Obesity (BMI 30-39.9); Obesity affecting pregnancy, antepartum; and Chronic hypertension affecting pregnancy on their problem list.  Patient reports no complaints.  Contractions: Not present. Vag. Bleeding: None.  Movement: Present. Denies leaking of fluid.   The following portions of the patient's history were reviewed and updated as appropriate: allergies, current medications, past family history, past medical history, past social history, past surgical history and problem list.   Objective:   Vitals:   10/28/18 1413  BP: 129/90  Pulse: 92  Weight: 214 lb 1.6 oz (97.1 kg)    Fetal Status: Fetal Heart Rate (bpm): 140 Fundal Height: 35 cm Movement: Present     General:  Alert, oriented and cooperative. Patient is in no acute distress.  Skin: Skin is warm and dry. No rash noted.   Cardiovascular: Normal heart rate noted  Respiratory: Normal respiratory effort, no problems with respiration noted  Abdomen: Soft, gravid, appropriate for gestational age.  Pain/Pressure: Absent     Pelvic: Cervical exam deferred        Extremities: Normal range of motion.  Edema: None  Mental Status: Normal mood and affect. Normal behavior. Normal judgment and thought content.   Assessment and Plan:  Pregnancy: G2P1001 at [redacted]w[redacted]d 1. Supervision of high risk pregnancy, antepartum - Patient doing well, no complaints - Anticipatory guidance on upcoming appointments   2. Chronic hypertension affecting pregnancy - BP stable at this time  - [place] MFM FETAL BPP WO NON STRESS; Future  3. Obesity affecting pregnancy, antepartum - TWG 29lbs  - F/u US for fetal  growth scheduled for next week   Preterm labor symptoms and general obstetric precautions including but not limited to vaginal bleeding, contractions, leaking of fluid and fetal movement were reviewed in detail with the patient. Please refer to After Visit Summary for other counseling recommendations.   Return in about 2 weeks (around 11/11/2018) for ROB.  Future Appointments  Date Time Provider Department Center  11/04/2018  2:45 PM [facility] [place] 2 [facility]  11/11/2018  1:30 PM Sharyon Cable, CNM [facility] None  11/11/2018  2:00 PM [facility] [place] 3 WH-[facility]  11/18/2018  2:00 PM [facility] [place] 3 [facility]  11/25/2018  2:00 PM [facility] [place] 3 WH-[facility]    Sharyon Cable, CNM

## 2018-10-28 NOTE — Patient Instructions (Signed)

## 2018-11-04 ENCOUNTER — Other Ambulatory Visit: Payer: Self-pay | Admitting: Certified Nurse Midwife

## 2018-11-04 ENCOUNTER — Ambulatory Visit (HOSPITAL_COMMUNITY)
Admission: RE | Admit: 2018-11-04 | Discharge: 2018-11-04 | Disposition: A | Discharge status: Home / Self Care | Payer: Medicaid Other | Source: Ambulatory Visit | Attending: Certified Nurse Midwife | Admitting: Certified Nurse Midwife

## 2018-11-04 ENCOUNTER — Other Ambulatory Visit: Payer: Self-pay

## 2018-11-04 ENCOUNTER — Inpatient Hospital Stay (HOSPITAL_COMMUNITY)
Admission: AD | Admit: 2018-11-04 | Discharge: 2018-11-04 | Disposition: A | Discharge status: Home / Self Care | Payer: Medicaid Other | Attending: Obstetrics & Gynecology | Admitting: Obstetrics & Gynecology

## 2018-11-04 ENCOUNTER — Ambulatory Visit (HOSPITAL_BASED_OUTPATIENT_CLINIC_OR_DEPARTMENT_OTHER): Payer: Medicaid Other | Admitting: *Deleted

## 2018-11-04 ENCOUNTER — Encounter (HOSPITAL_COMMUNITY): Payer: Self-pay

## 2018-11-04 ENCOUNTER — Ambulatory Visit (HOSPITAL_COMMUNITY): Payer: Medicaid Other | Admitting: *Deleted

## 2018-11-04 DIAGNOSIS — O99210 Obesity complicating pregnancy, unspecified trimester: Secondary | ICD-10-CM | POA: Insufficient documentation

## 2018-11-04 DIAGNOSIS — O24913 Unspecified diabetes mellitus in pregnancy, third trimester: Secondary | ICD-10-CM | POA: Diagnosis not present

## 2018-11-04 DIAGNOSIS — O0990 Supervision of high risk pregnancy, unspecified, unspecified trimester: Secondary | ICD-10-CM

## 2018-11-04 DIAGNOSIS — O0993 Supervision of high risk pregnancy, unspecified, third trimester: Secondary | ICD-10-CM | POA: Insufficient documentation

## 2018-11-04 DIAGNOSIS — Z3A35 35 weeks gestation of pregnancy: Secondary | ICD-10-CM | POA: Diagnosis not present

## 2018-11-04 DIAGNOSIS — Z362 Encounter for other antenatal screening follow-up: Secondary | ICD-10-CM

## 2018-11-04 DIAGNOSIS — O99213 Obesity complicating pregnancy, third trimester: Secondary | ICD-10-CM

## 2018-11-04 DIAGNOSIS — E669 Obesity, unspecified: Secondary | ICD-10-CM | POA: Diagnosis not present

## 2018-11-04 DIAGNOSIS — O10013 Pre-existing essential hypertension complicating pregnancy, third trimester: Secondary | ICD-10-CM | POA: Insufficient documentation

## 2018-11-04 DIAGNOSIS — O10919 Unspecified pre-existing hypertension complicating pregnancy, unspecified trimester: Secondary | ICD-10-CM

## 2018-11-04 DIAGNOSIS — Z3689 Encounter for other specified antenatal screening: Secondary | ICD-10-CM

## 2018-11-04 DIAGNOSIS — Z9289 Personal history of other medical treatment: Secondary | ICD-10-CM | POA: Diagnosis not present

## 2018-11-04 DIAGNOSIS — O289 Unspecified abnormal findings on antenatal screening of mother: Secondary | ICD-10-CM

## 2018-11-04 DIAGNOSIS — O09293 Supervision of pregnancy with other poor reproductive or obstetric history, third trimester: Secondary | ICD-10-CM | POA: Diagnosis not present

## 2018-11-04 LAB — URINALYSIS, ROUTINE W REFLEX MICROSCOPIC
Bilirubin Urine: NEGATIVE
Glucose, UA: NEGATIVE mg/dL
Hgb urine dipstick: NEGATIVE
Ketones, ur: NEGATIVE mg/dL
Leukocytes,Ua: NEGATIVE
Nitrite: NEGATIVE
Protein, ur: NEGATIVE mg/dL
Specific Gravity, Urine: 1.011 (ref 1.005–1.030)
pH: 7 (ref 5.0–8.0)

## 2018-11-04 NOTE — Procedures (Signed)
Alicia Gay [birth year] [redacted]w[redacted]d  Fetus A Non-Stress Test Interpretation for 11/04/18  Indication: Unsatisfactory BPP  Fetal Heart Rate A Mode: External Baseline Rate (A): 140 bpm Variability: Moderate Accelerations: 10 x 10 Decelerations: None Multiple birth?: No  Uterine Activity Mode: Palpation, Toco Contraction Frequency (min): Erratic UI Contraction Quality: Mild Resting Tone Palpated: Relaxed Resting Time: Adequate  Interpretation (Fetal Testing) Nonstress Test Interpretation: Non-reactive Comments: EFM tracing reviewed by Dr. Grace Bushy and patient was advised to go to MAU for further EFM and evaluation.

## 2018-11-04 NOTE — MAU Provider Note (Signed)
History     CSN: [id number]  Arrival date and time: 11/04/18 1803   First Provider Initiated Contact with Patient 11/04/18 1840      Chief Complaint  Patient presents with  . for monitoring   Ms. [name] is a 28 y.o. G2P1001 at [redacted]w[redacted]d who presents to MAU for antenatal monitoring and evaluation. Pt was seen at MFM today and had 6/8 BPP with non-reactive NST and was sent to MAU for further evaluation.  Pt denies VB, LOF, ctx, decreased FM, vaginal discharge/odor/itching. Pt denies N/V, abdominal pain, constipation, diarrhea, or urinary problems. Pt denies fever, chills, fatigue, sweating or changes in appetite. Pt denies SOB or chest pain. Pt denies dizziness, HA, light-headedness, weakness.  Problems this pregnancy include: cHTN. Allergies? NKDA Current medications/supplements? PNVs, no BP meds. Prenatal care provider? [facility], next appt scheduled for 11/11/2018 (office visit + [place])    OB History    Gravida  2   Para  1   Term  1   Preterm      AB      Living  1     SAB      TAB      Ectopic      Multiple  0   Live Births  1           Past Medical History:  Diagnosis Date  . Diabetes mellitus without complication (HCC)   . Gestational diabetes   . Hypertension   . Supervision of high-risk pregnancy 02/02/2016   Late to prenatal care, started at 16 weeks  Clinic  [facility] Prenatal Labs Dating  [place] ; late to care Blood type: O/Positive/-- (06/16 1321)  Genetic Screen 1 Screen:    AFP:     Quad: declined   NIPS: declined Antibody:Negative (06/16 1321) Anatomic [place]  Normal  ;female fetus Rubella: 9.68 (06/16 1321) GTT  Third trimester: impaired: 97/106/73 RPR: Non Reactive (08/31 1345)  Flu vaccine D    Past Surgical History:  Procedure Laterality Date  . NO PAST SURGERIES      Family History  Problem Relation Age of Onset  . Cancer Maternal Aunt   . Diabetes Paternal Grandmother     Social History   Tobacco Use  . Smoking  status: Never Smoker  . Smokeless tobacco: Never Used  Substance Use Topics  . Alcohol use: No    Comment: prior to preg  . Drug use: No    Allergies: No Known Allergies  Medications Prior to Admission  Medication Sig Dispense Refill Last Dose  . Prenat w/o A-FeCbn-Meth-FA-DHA (PRENATE MINI) 29-0.6-0.4-350 MG CAPS Take 1 capsule by mouth daily before breakfast. 90 capsule 3 11/04/2018 at Unknown time  . sodium chloride (OCEAN) 0.65 % SOLN nasal spray Place 1 spray into both nostrils as needed for congestion.   Past Month at Unknown time    Review of Systems  Constitutional: Negative for chills, fatigue and fever.  Respiratory: Negative for shortness of breath.   Cardiovascular: Negative for chest pain.  Gastrointestinal: Negative for abdominal pain, constipation, diarrhea, nausea and vomiting.  Genitourinary: Negative for decreased urine volume, dysuria, flank pain, pelvic pain, vaginal bleeding and vaginal discharge.  Neurological: Negative for dizziness, weakness, light-headedness and headaches.   Physical Exam   Blood pressure 131/76, pulse 81, temperature 97.7 F (36.5 C), temperature source Oral, resp. rate 18, last menstrual period 03/31/2018, currently breastfeeding.  Patient Vitals for the past 24 hrs:  BP Temp Temp src Pulse Resp  11/04/18 1834  131/76 97.7 F (36.5 C) Oral 81 18    Physical Exam  Constitutional: She is oriented to person, place, and time. She appears well-developed and well-nourished. No distress.  HENT:  Head: Normocephalic.  Respiratory: Effort normal.  Neurological: She is alert and oriented to person, place, and time.  Skin: Skin is warm and dry. She is not diaphoretic.  Psychiatric: She has a normal mood and affect. Her behavior is normal.   No results found for this or any previous visit (from the past 24 hour(s)).  [place] Mfm Fetal Bpp W/nonstress  Result Date: 11/04/2018 ----------------------------------------------------------------------   OBSTETRICS REPORT                      (Corrected Final 11/04/2018 05:21 pm) ---------------------------------------------------------------------- Patient Info  ID #:        [id number]                          D.O.B.:  1990/11/24 (27 yrs)  Name:        Alicia Gay                 Visit Date: 11/04/2018 03:11 pm ---------------------------------------------------------------------- Performed By  Performed By:      Lenise Arena        [facility]  Attending:         Lin Landsman      Address:           [address]                     [address]                                        [address]                                                               [phone]  [place]                                                               [address]  Referred By:       Rudean Curt             [facility]  Ref. Address:      Faculty ---------------------------------------------------------------------- Orders   #  Description                           Code         Ordered By   1  [place] MFM FETAL BPP W/NONSTRESS          60454.0      Steward Drone  ----------------------------------------------------------------------   #  Order #                     Accession #                 Episode #   1  [id number]                   [id number]                  [id number]  ---------------------------------------------------------------------- Indications   Abnormal finding on antenatal screening         O28.9   Hypertension - Chronic/Pre-existing             O10.019   Poor obstetric history: Previous gestational    O09.299   diabetes   Encounter for other  antenatal screening         Z36.2   follow-up   [redacted] weeks gestation of pregnancy                 Z3A.35  ---------------------------------------------------------------------- Vital Signs  Weight (lb):  214                               Height:        5'7"  BMI:          33.51 ---------------------------------------------------------------------- Fetal Evaluation  Num Of Fetuses:          1  Fetal Heart Rate(bpm):   144  Cardiac Activity:        Observed  Presentation:            Cephalic  Placenta:                Posterior  Amniotic Fluid  AFI FV:      Within normal limits  AFI Sum(cm)     %Tile       Largest Pocket(cm)  18              67  5.27  RUQ(cm)       RLQ(cm)        LUQ(cm)        LLQ(cm)  5.27          4.96           4.56           3.21 ---------------------------------------------------------------------- Biophysical Evaluation  Amniotic F.V:   Within normal limits        F. Tone:         Observed  F. Movement:    Not Observed                N.S.T:           Nonreactive  F. Breathing:   Observed                    Score:           6/10 ---------------------------------------------------------------------- OB History  Gravidity:     2         Term:  1          Prem:  0        SAB:   0  TOP:           0       Ectopic: 0         Living: 1 ---------------------------------------------------------------------- Gestational Age  LMP:            31w 1d       Date:  03/31/18                   EDD:  01/05/19  Best:           Consuello Closs 3d    Det. By:  U/S  (08/14/18)            EDD:  12/06/18 ---------------------------------------------------------------------- Anatomy  Thoracic:               Appears normal         Stomach:                Appears normal, left                                                                         sided  Heart:                  Appears normal         Abdomen:                Appears normal                          (4CH, axis, and situs)  LVOT:                   Appears normal          Kidneys:                Appear normal  Diaphragm:              Appears normal         Bladder:  Appears normal ---------------------------------------------------------------------- Cervix Uterus Adnexa  Cervix  Not visualized (advanced GA >24wks) ---------------------------------------------------------------------- Impression  Chronic Hypertension  Biophyiscal profile 6/10 (-2 movement;-2 NRNST))  Occasional premature atrial contractios ---------------------------------------------------------------------- Recommendations  Repeat BPP in 1 week  TO MAU ----------------------------------------------------------------------                    Lin Landsman, MD Electronically Signed Corrected Final Report  11/04/2018 05:21 pm ----------------------------------------------------------------------  [place] Mfm Ob Follow Up  Result Date: 10/09/2018 ----------------------------------------------------------------------  OBSTETRICS REPORT                       (Signed Final 10/09/2018 03:30 pm) ---------------------------------------------------------------------- Patient Info  ID #:       [id number]                          D.O.B.:  1990-11-30 (27 yrs)  Name:       Alicia Gay                 Visit Date: 10/09/2018 02:29 pm ---------------------------------------------------------------------- Performed By  Performed By:     Lenise Arena        Secondary [facility]   [facility]                                                             [facility]  Attending:        Noralee Space MD        Address:          [address]                                                             [address] [phone]  [place]                                                              [address]  Referred By:      Rudean Curt             Location:         [facility]                    [facility]  Ref. Address:     Faculty ---------------------------------------------------------------------- Orders   #  Description                          Code         Ordered By   1  [place] MFM OB FOLLOW UP                  60454.09     Lin Landsman  ----------------------------------------------------------------------   #  Order #                    Accession #                 Episode #   1  [id number]                  [id number]                  [id number]  ---------------------------------------------------------------------- Indications   Hypertension - Chronic/Pre-existing            O10.019   Poor obstetric history: Previous gestational   O09.299   diabetes   Encounter for other antenatal screening        Z36.2   follow-up   [redacted] weeks gestation of pregnancy                Z3A.31  ---------------------------------------------------------------------- Vital Signs  Weight (lb): 214                               Height:        5'7"  BMI:         33.51 ---------------------------------------------------------------------- Fetal Evaluation  Num Of Fetuses:         1  Fetal Heart Rate(bpm):  154  Cardiac Activity:       Observed  Presentation:           Cephalic  Placenta:               Posterior  P. Cord Insertion:      Previously Visualized  Amniotic Fluid  AFI FV:      Within normal limits  AFI Sum(cm)     %Tile       Largest Pocket(cm)  15.98           57  4.96  RUQ(cm)       RLQ(cm)       LUQ(cm)        LLQ(cm)  4.96          3.28          4.73           3.01 ---------------------------------------------------------------------- Biometry  BPD:      79.6  mm     G. Age:  32w 0d         48  %    CI:        73.98   %    70 - 86                                                          FL/HC:      21.1   %     19.1 - 21.3  HC:      293.9  mm     G. Age:  32w 3d         32  %    HC/AC:      0.97        0.96 - 1.17  AC:       304   mm     G. Age:  34w 3d       > 97  %    FL/BPD:     78.0   %    71 - 87  FL:       62.1  mm     G. Age:  32w 1d         50  %    FL/AC:      20.4   %    20 - 24  HUM:      54.3  mm     G. Age:  31w 4d         49  %  LV:        3.2  mm  Est. FW:    2168  gm    4 lb 12 oz      80  % ---------------------------------------------------------------------- OB History  Gravidity:    2         Term:   1        Prem:   0        SAB:   0  TOP:          0       Ectopic:  0        Living: 1 ---------------------------------------------------------------------- Gestational Age  LMP:           27w 3d        Date:  03/31/18                 EDD:   01/05/19  U/S Today:     32w 5d                                        EDD:   11/29/18  Best:          31w 5d     Det. By:  U/S  (08/14/18)  EDD:   12/06/18 ---------------------------------------------------------------------- Anatomy  Cranium:               Appears normal         Aortic Arch:            Previously seen  Cavum:                 Appears normal         Ductal Arch:            Previously seen  Ventricles:            Appears normal         Diaphragm:              Appears normal  Choroid Plexus:        Previously seen        Stomach:                Appears normal, left                                                                        sided  Cerebellum:            Previously seen        Abdomen:                Appears normal  Posterior Fossa:       Previously seen        Abdominal Wall:         Previously seen  Nuchal Fold:           Not applicable (>20    Cord Vessels:           Previously seen                         wks GA)  Face:                  Orbits and profile     Kidneys:                Appear normal                         previously seen  Lips:                  Previously seen        Bladder:                Appears normal  Thoracic:               Appears normal         Spine:                  Previously seen  Heart:                 Previously seen        Upper Extremities:      Previously seen  RVOT:                  Previously seen        Lower Extremities:  Previously seen  LVOT:                  Previously seen  Other:  Normal genaitalia. Parents do not wish to know sex of fetus. Heels          and 5th digit previously visualized. Open hands previously visualized.          Nasal bone previously visualized. ---------------------------------------------------------------------- Cervix Uterus Adnexa  Cervix  Not visualized (advanced GA >24wks) ---------------------------------------------------------------------- Impression  Chronic hypertension. Well-controlled without  antihypertensives. Amniotic fluid is normal and good fetal  activity is seen. Fetal growth is appropriate for gestational  age. ---------------------------------------------------------------------- Recommendations  -An appointment was made for her to return in 4 weeks for  fetal growth assessment.  -Weekly antenatal testing from next visit till delivery. ----------------------------------------------------------------------                  Noralee Space, MD Electronically Signed Final Report   10/09/2018 03:30 pm ----------------------------------------------------------------------   MAU Course  Procedures  MDM -pt here for monitoring after BPP 6/8 + NRNST -EFM baseline 140/145, mod variability, pos accel, no decels. TOCO irregular ctx, not felt by pt. -UA pending at time of discharge -consulted with Dr. Despina Hidden, stated tracing is reactive, OK to discharge pt -pt discharged to home  Orders Placed This Encounter  Procedures  . Urinalysis, Routine w reflex microscopic    Standing Status:   Standing    Number of Occurrences:   1   No orders of the defined types were placed in this encounter.   Assessment and Plan   1. NST (non-stress test) reactive   2.  Obesity affecting pregnancy, antepartum   3. Supervision of high risk pregnancy, antepartum   4. [redacted] weeks gestation of pregnancy     Allergies as of 11/04/2018   No Known Allergies     Medication List    TAKE these medications   Prenate Mini 29-0.6-0.4-350 MG Caps Take 1 capsule by mouth daily before breakfast.   sodium chloride 0.65 % Soln nasal spray Commonly known as:  OCEAN Place 1 spray into both nostrils as needed for congestion.      -fetal movement/kick counting discussed -return MAU precautions given -f/u at next scheduled visit on 11/10/2017 -pt discharged to home in stable condition  Odie Sera Noach Calvillo 11/04/2018, 6:41 PM

## 2018-11-04 NOTE — Discharge Instructions (Signed)
Fetal Movement Counts Patient Name: ________________________________________________ Patient Due Date: ____________________ What is a fetal movement count?  A fetal movement count is the number of times that you feel your baby move during a certain amount of time. This may also be called a fetal kick count. A fetal movement count is recommended for every pregnant woman. You may be asked to start counting fetal movements as early as week 28 of your pregnancy. Pay attention to when your baby is most active. You may notice your baby's sleep and wake cycles. You may also notice things that make your baby move more. You should do a fetal movement count:  When your baby is normally most active.  At the same time each day. A good time to count movements is while you are resting, after having something to eat and drink. How do I count fetal movements? 1. Find a quiet, comfortable area. Sit, or lie down on your side. 2. Write down the date, the start time and stop time, and the number of movements that you felt between those two times. Take this information with you to your health care visits. 3. For 2 hours, count kicks, flutters, swishes, rolls, and jabs. You should feel at least 10 movements during 2 hours. 4. You may stop counting after you have felt 10 movements. 5. If you do not feel 10 movements in 2 hours, have something to eat and drink. Then, keep resting and counting for 1 hour. If you feel at least 4 movements during that hour, you may stop counting. Contact a health care provider if:  You feel fewer than 4 movements in 2 hours.  Your baby is not moving like he or she usually does. Date: ____________ Start time: ____________ Stop time: ____________ Movements: ____________ Date: ____________ Start time: ____________ Stop time: ____________ Movements: ____________ Date: ____________ Start time: ____________ Stop time: ____________ Movements: ____________ Date: ____________ Start time:  ____________ Stop time: ____________ Movements: ____________ Date: ____________ Start time: ____________ Stop time: ____________ Movements: ____________ Date: ____________ Start time: ____________ Stop time: ____________ Movements: ____________ Date: ____________ Start time: ____________ Stop time: ____________ Movements: ____________ Date: ____________ Start time: ____________ Stop time: ____________ Movements: ____________ Date: ____________ Start time: ____________ Stop time: ____________ Movements: ____________ This information is not intended to replace advice given to you by your health care provider. Make sure you discuss any questions you have with your health care provider. Document Released: 09/04/2006 Document Revised: 04/03/2016 Document Reviewed: 09/14/2015 Elsevier Interactive Patient Education  2019 [employer] Nonstress Test A nonstress test is a procedure that is done during pregnancy in order to check the baby's heartbeat. The procedure can help show if the baby (fetus) is healthy. It is commonly done when:  The baby is past his or her due date.  The pregnancy is high risk.  The baby is moving less than normal.  The mother has lost a pregnancy in the past.  The health care provider suspects a problem with the baby's growth.  There is too much or too little amniotic fluid. The procedure is often done in the third trimester of pregnancy to find out if an early delivery is needed and whether such a delivery is safe. During a nonstress test, the baby's heartbeat is monitored when the baby is resting and when the baby is moving. If the baby is healthy, the heart rate will increase when he or she moves or kicks and will return to normal when he or she rests. Tell a health   care provider about:  Any allergies you have.  Any medical conditions you have.  All medicines you are taking, including vitamins, herbs, eye drops, creams, and over-the-counter medicines. What are the  risks? There are no risks to you or your baby from a nonstress test. This procedure should not be painful or uncomfortable. What happens before the procedure?  Eat a meal right before the test or as directed by your health care provider. Food may help encourage the baby to move.  Use the restroom right before the test. What happens during the procedure?  Two monitors will be placed on your abdomen. One will record the baby's heart rate and the other will record the contractions of your uterus.  You may be asked to lie down on your side or to sit upright.  You may be given a button to press when you feel your baby move.  Your health care provider will listen to your baby's heartbeat and recorded it. He or she may also watch your baby's heartbeat on a screen.  If the baby seems to be sleeping, you may be asked to drink some juice or soda, eat a snack, or change positions. The procedure may vary among health care providers and hospitals. What happens after the procedure?  Your health care provider will discuss the test results with you and make recommendations for the future. Depending on the results, your health care provider may order additional tests or another course of action.  If your health care provider gave you any diet or activity instructions, make sure to follow them.  Keep all follow-up visits as told by your health care provider. This is important. Summary  A nonstress test is a procedure that is done during pregnancy in order to check the baby's heartbeat. The procedure can help show if the baby is healthy.  The procedure is often done in the third trimester of pregnancy to find out if an early delivery is needed and whether such a delivery is safe.  During a nonstress test, the baby's heartbeat is monitored when the baby is resting and when the baby is moving. If the baby is healthy, the heart rate will increase when he or she moves or kicks and will return to normal when  he or she rests.  Your health care provider will discuss the test results with you and make recommendations for the future. This information is not intended to replace advice given to you by your health care provider. Make sure you discuss any questions you have with your health care provider. Document Released: 07/26/2002 Document Revised: 11/14/2016 Document Reviewed: 11/14/2016 Elsevier Interactive Patient Education  2019 [employer].

## 2018-11-04 NOTE — Progress Notes (Signed)
Report called to [facility], RN in MAU that patient will be arriving there for further EFM and evaluation due to BPP 6/8 and NR NST.

## 2018-11-04 NOTE — MAU Note (Signed)
Sent from MFM, BPP today 6/8.  No c/o.  Denies pain, leaking or bleeding.  Denies PIH symptoms.

## 2018-11-11 ENCOUNTER — Ambulatory Visit (HOSPITAL_COMMUNITY)
Admission: RE | Admit: 2018-11-11 | Discharge: 2018-11-11 | Disposition: A | Discharge status: Home / Self Care | Payer: Medicaid Other | Source: Ambulatory Visit | Attending: Obstetrics and Gynecology | Admitting: Obstetrics and Gynecology

## 2018-11-11 ENCOUNTER — Other Ambulatory Visit (HOSPITAL_COMMUNITY)
Admission: RE | Admit: 2018-11-11 | Discharge: 2018-11-11 | Disposition: A | Discharge status: Home / Self Care | Payer: Medicaid Other | Source: Ambulatory Visit | Attending: Certified Nurse Midwife | Admitting: Certified Nurse Midwife

## 2018-11-11 ENCOUNTER — Ambulatory Visit (HOSPITAL_COMMUNITY): Payer: Medicaid Other | Admitting: *Deleted

## 2018-11-11 ENCOUNTER — Encounter (HOSPITAL_COMMUNITY): Payer: Self-pay

## 2018-11-11 ENCOUNTER — Other Ambulatory Visit: Payer: Self-pay

## 2018-11-11 ENCOUNTER — Encounter: Payer: Self-pay | Admitting: Certified Nurse Midwife

## 2018-11-11 ENCOUNTER — Ambulatory Visit (INDEPENDENT_AMBULATORY_CARE_PROVIDER_SITE_OTHER): Payer: Medicaid Other | Admitting: Certified Nurse Midwife

## 2018-11-11 VITALS — BP 125/83 | HR 79 | Wt 212.7 lb

## 2018-11-11 VITALS — BP 123/87 | HR 96 | Temp 97.7°F

## 2018-11-11 DIAGNOSIS — O0990 Supervision of high risk pregnancy, unspecified, unspecified trimester: Secondary | ICD-10-CM | POA: Diagnosis not present

## 2018-11-11 DIAGNOSIS — Z364 Encounter for antenatal screening for fetal growth retardation: Secondary | ICD-10-CM

## 2018-11-11 DIAGNOSIS — O99210 Obesity complicating pregnancy, unspecified trimester: Secondary | ICD-10-CM | POA: Insufficient documentation

## 2018-11-11 DIAGNOSIS — O10919 Unspecified pre-existing hypertension complicating pregnancy, unspecified trimester: Secondary | ICD-10-CM

## 2018-11-11 DIAGNOSIS — O09293 Supervision of pregnancy with other poor reproductive or obstetric history, third trimester: Secondary | ICD-10-CM

## 2018-11-11 DIAGNOSIS — O289 Unspecified abnormal findings on antenatal screening of mother: Secondary | ICD-10-CM | POA: Diagnosis not present

## 2018-11-11 DIAGNOSIS — O10013 Pre-existing essential hypertension complicating pregnancy, third trimester: Secondary | ICD-10-CM | POA: Diagnosis not present

## 2018-11-11 DIAGNOSIS — Z3A36 36 weeks gestation of pregnancy: Secondary | ICD-10-CM | POA: Diagnosis not present

## 2018-11-11 DIAGNOSIS — Z362 Encounter for other antenatal screening follow-up: Secondary | ICD-10-CM

## 2018-11-11 DIAGNOSIS — O10913 Unspecified pre-existing hypertension complicating pregnancy, third trimester: Secondary | ICD-10-CM

## 2018-11-11 DIAGNOSIS — O99213 Obesity complicating pregnancy, third trimester: Secondary | ICD-10-CM

## 2018-11-11 NOTE — Progress Notes (Signed)
   PRENATAL VISIT NOTE  Subjective:  Alicia Gay is a 28 y.o. G2P1001 at [redacted]w[redacted]d being seen today for ongoing prenatal care.  She is currently monitored for the following issues for this high-risk pregnancy and has History of gestational diabetes in prior pregnancy, currently pregnant; Chronic hypertension; Supervision of high risk pregnancy, antepartum; Obesity (BMI 30-39.9); Obesity affecting pregnancy, antepartum; and Chronic hypertension affecting pregnancy on their problem list.  Patient reports no complaints.  Contractions: Irritability. Vag. Bleeding: None.  Movement: Present. Denies leaking of fluid.   The following portions of the patient's history were reviewed and updated as appropriate: allergies, current medications, past family history, past medical history, past social history, past surgical history and problem list.   Objective:   Vitals:   11/11/18 1348  BP: 125/83  Pulse: 79  Weight: 212 lb 11.2 oz (96.5 kg)    Fetal Status: Fetal Heart Rate (bpm): 146 Fundal Height: 37 cm Movement: Present  Presentation: Vertex  General:  Alert, oriented and cooperative. Patient is in no acute distress.  Skin: Skin is warm and dry. No rash noted.   Cardiovascular: Normal heart rate noted  Respiratory: Normal respiratory effort, no problems with respiration noted  Abdomen: Soft, gravid, appropriate for gestational age.  Pain/Pressure: Absent     Pelvic: Cervical exam performed Dilation: 1 Effacement (%): Thick Station: -3  Extremities: Normal range of motion.  Edema: None  Mental Status: Normal mood and affect. Normal behavior. Normal judgment and thought content.   Assessment and Plan:  Pregnancy: G2P1001 at [redacted]w[redacted]d 1. Supervision of high risk pregnancy, antepartum - Patient doing well, no complaints - Educated and discussed recommendations of COVID19 including hand washing, social distancing and limited outings- patient verbalizes understanding  - Discussed use of TELEHEALTH for  some appointments, patient reports having assess to BP cuff at home and has weekly BPP at MFM for [facility] with blood pressure checks there.  - Cervicovaginal ancillary only( [place]) - Culture, beta strep (group b only)  2. Chronic hypertension affecting pregnancy - BP stable at this time  - Continue weekly BPP appointments, plan for IOL at 40 weeks   - BPP scheduled for today after OB appointment   3. Obesity affecting pregnancy, antepartum  Preterm labor symptoms and general obstetric precautions including but not limited to vaginal bleeding, contractions, leaking of fluid and fetal movement were reviewed in detail with the patient. Please refer to After Visit Summary for other counseling recommendations.   Return in about 1 week (around 11/18/2018) for TELEVISIT- ROB (has BP cuff at home).  Future Appointments  Date Time Provider Department Center  11/18/2018  2:00 PM [facility] NURSE [facility]  11/18/2018  2:00 PM WH-MFC [place] 3 WH-MFCUS MFC-US  11/19/2018  2:00 PM Sharyon Cable, CNM [facility] None  11/25/2018  2:00 PM [facility]-MFC NURSE WH-MFC MFC-US  11/25/2018  2:00 PM WH-MFC [place] 3 WH-MFCUS MFC-US    Sharyon Cable, CNM

## 2018-11-11 NOTE — Patient Instructions (Signed)
-   Encouraged patient to call ahead if experiencing respiratory symptoms with fever so that she can be triaged and directed appropriately if testing is warranted   Reasons to go to MAU:  1.  Contractions are  4-5 minutes apart or less, each last 1 minute, these have been going on for 1-2 hours, and you cannot walk or talk during them 2.  You have a large gush of fluid, or a trickle of fluid that will not stop and you have to wear a pad 3.  You have bleeding that is bright red, heavier than spotting--like menstrual bleeding (spotting can be normal in early labor or after a check of your cervix) 4.  You do not feel the baby moving like he/she normally does

## 2018-11-12 LAB — CERVICOVAGINAL ANCILLARY ONLY
Chlamydia: NEGATIVE
Neisseria Gonorrhea: NEGATIVE

## 2018-11-15 LAB — CULTURE, BETA STREP (GROUP B ONLY): Strep Gp B Culture: POSITIVE — AB

## 2018-11-18 ENCOUNTER — Ambulatory Visit (HOSPITAL_COMMUNITY): Payer: Medicaid Other

## 2018-11-19 ENCOUNTER — Other Ambulatory Visit: Payer: Self-pay

## 2018-11-19 ENCOUNTER — Ambulatory Visit (INDEPENDENT_AMBULATORY_CARE_PROVIDER_SITE_OTHER): Payer: Medicaid Other | Admitting: Certified Nurse Midwife

## 2018-11-19 ENCOUNTER — Encounter: Payer: Self-pay | Admitting: Certified Nurse Midwife

## 2018-11-19 DIAGNOSIS — O0990 Supervision of high risk pregnancy, unspecified, unspecified trimester: Secondary | ICD-10-CM

## 2018-11-19 DIAGNOSIS — Z3A37 37 weeks gestation of pregnancy: Secondary | ICD-10-CM

## 2018-11-19 DIAGNOSIS — O99213 Obesity complicating pregnancy, third trimester: Secondary | ICD-10-CM

## 2018-11-19 DIAGNOSIS — O10919 Unspecified pre-existing hypertension complicating pregnancy, unspecified trimester: Secondary | ICD-10-CM

## 2018-11-19 DIAGNOSIS — O10913 Unspecified pre-existing hypertension complicating pregnancy, third trimester: Secondary | ICD-10-CM

## 2018-11-19 DIAGNOSIS — O99210 Obesity complicating pregnancy, unspecified trimester: Secondary | ICD-10-CM

## 2018-11-19 NOTE — Progress Notes (Signed)
   TELEHEALTH VIRTUAL OBSTETRICS VISIT ENCOUNTER NOTE  I connected with Alicia Gay on 11/19/18 at  2:00 PM EDT by telephone at home and verified that I am speaking with the correct person using two identifiers.   I discussed the limitations, risks, security and privacy concerns of performing an evaluation and management service by telephone and the availability of in person appointments. I also discussed with the patient that there may be a patient responsible charge related to this service. The patient expressed understanding and agreed to proceed.  Subjective:  Alicia Gay is a 28 y.o. G2P1001 at [redacted]w[redacted]d being followed for ongoing prenatal care.  She is currently monitored for the following issues for this high-risk pregnancy and has History of gestational diabetes in prior pregnancy, currently pregnant; Chronic hypertension; Supervision of high risk pregnancy, antepartum; Obesity (BMI 30-39.9); Obesity affecting pregnancy, antepartum; and Chronic hypertension affecting pregnancy on their problem list.  Patient reports no complaints. Reports fetal movement. Denies any contractions, bleeding or leaking of fluid.   The following portions of the patient's history were reviewed and updated as appropriate: allergies, current medications, past family history, past medical history, past social history, past surgical history and problem list.   Objective:   General:  Alert, oriented and cooperative.   Mental Status: Normal mood and affect perceived. Normal judgment and thought content.  Rest of physical exam deferred due to type of encounter  Assessment and Plan:  Pregnancy: G2P1001 at [redacted]w[redacted]d 1. Supervision of high risk pregnancy, antepartum - Patient doing well no complaints - Anticipatory guidance on upcoming appointments  - COVID19 precautions discussed   2. Obesity affecting pregnancy, antepartum - Est. FW:    2996  gm    6 lb 10 oz      71  % - [place] completed on 3/25 @ 35 weeks   3.  Chronic hypertension affecting pregnancy - BP stable at this time  - Plan for IOL @ 40 weeks  - Follow up as scheduled for [place] and BPP   Term labor symptoms and general obstetric precautions including but not limited to vaginal bleeding, contractions, leaking of fluid and fetal movement were reviewed in detail with the patient.  I discussed the assessment and treatment plan with the patient. The patient was provided an opportunity to ask questions and all were answered. The patient agreed with the plan and demonstrated an understanding of the instructions. The patient was advised to call back or seek an in-person office evaluation/go to MAU at [facility] for any urgent or concerning symptoms. Please refer to After Visit Summary for other counseling recommendations.   I provided 6 minutes of non-face-to-face time during this encounter.  Return in about 1 week (around 11/26/2018) for ROB-TELEVISIT.  Future Appointments  Date Time Provider Department Center  11/30/2018  2:15 PM [facility] NURSE WH-MFC MFC-US  11/30/2018  2:15 PM WH-MFC [place] 4 WH-MFCUS MFC-US    Sharyon Cable, CNM Center for [employer], [facility]

## 2018-11-25 ENCOUNTER — Ambulatory Visit (HOSPITAL_COMMUNITY): Payer: Medicaid Other

## 2018-11-26 ENCOUNTER — Other Ambulatory Visit: Payer: Self-pay

## 2018-11-26 ENCOUNTER — Ambulatory Visit (INDEPENDENT_AMBULATORY_CARE_PROVIDER_SITE_OTHER): Payer: Medicaid Other | Admitting: Certified Nurse Midwife

## 2018-11-26 ENCOUNTER — Encounter: Payer: Self-pay | Admitting: Certified Nurse Midwife

## 2018-11-26 VITALS — BP 123/85 | HR 87

## 2018-11-26 DIAGNOSIS — O99213 Obesity complicating pregnancy, third trimester: Secondary | ICD-10-CM

## 2018-11-26 DIAGNOSIS — Z3A38 38 weeks gestation of pregnancy: Secondary | ICD-10-CM

## 2018-11-26 DIAGNOSIS — O0993 Supervision of high risk pregnancy, unspecified, third trimester: Secondary | ICD-10-CM

## 2018-11-26 DIAGNOSIS — O0990 Supervision of high risk pregnancy, unspecified, unspecified trimester: Secondary | ICD-10-CM

## 2018-11-26 DIAGNOSIS — O10913 Unspecified pre-existing hypertension complicating pregnancy, third trimester: Secondary | ICD-10-CM

## 2018-11-26 DIAGNOSIS — O10919 Unspecified pre-existing hypertension complicating pregnancy, unspecified trimester: Secondary | ICD-10-CM

## 2018-11-26 DIAGNOSIS — O99210 Obesity complicating pregnancy, unspecified trimester: Secondary | ICD-10-CM

## 2018-11-26 NOTE — Progress Notes (Signed)
S/W Pt via tele visit, took BP at home, 123/85, pulse 87. Pt reports fetal movement, denies pain.

## 2018-11-26 NOTE — Progress Notes (Signed)
   TELEHEALTH VIRTUAL OBSTETRICS VISIT ENCOUNTER NOTE  I connected with Alicia Gay on 11/26/18 at  1:55 PM EDT by telephone at home and verified that I am speaking with the correct person using two identifiers.   I discussed the limitations, risks, security and privacy concerns of performing an evaluation and management service by telephone and the availability of in person appointments. I also discussed with the patient that there may be a patient responsible charge related to this service. The patient expressed understanding and agreed to proceed.  Subjective:  Alicia Gay is a 28 y.o. G2P1001 at [redacted]w[redacted]d being followed for ongoing prenatal care.  She is currently monitored for the following issues for this high-risk pregnancy and has History of gestational diabetes in prior pregnancy, currently pregnant; Chronic hypertension; Supervision of high risk pregnancy, antepartum; Obesity (BMI 30-39.9); Obesity affecting pregnancy, antepartum; and Chronic hypertension affecting pregnancy on their problem list.  Patient reports no complaints. Reports fetal movement. Denies any contractions, bleeding or leaking of fluid.   The following portions of the patient's history were reviewed and updated as appropriate: allergies, current medications, past family history, past medical history, past social history, past surgical history and problem list.   Objective:   General:  Alert, oriented and cooperative.   Mental Status: Normal mood and affect perceived. Normal judgment and thought content.  Rest of physical exam deferred due to type of encounter  Assessment and Plan:  Pregnancy: G2P1001 at [redacted]w[redacted]d 1. Supervision of high risk pregnancy, antepartum - Routine prenatal care  - Anticipatory guidance on upcoming appointment with next being telehealth due to [place] and BP check scheduled for that week.  - COVID19 precautions   2. Obesity affecting pregnancy, antepartum - TWG 27 lbs during pregnancy  -  EFW @[redacted]w[redacted]d  was 6lbs10oz (2996gm) 71%   3. Chronic hypertension affecting pregnancy IOL scheduled for 4/20 @ 0730, patient notified through MyChart message  - orders for admission placed  - continue follow up with MFM for BPP as scheduled on 4/13  Term labor symptoms and general obstetric precautions including but not limited to vaginal bleeding, contractions, leaking of fluid and fetal movement were reviewed in detail with the patient.  I discussed the assessment and treatment plan with the patient. The patient was provided an opportunity to ask questions and all were answered. The patient agreed with the plan and demonstrated an understanding of the instructions. The patient was advised to call back or seek an in-person office evaluation/go to MAU at [facility] for any urgent or concerning symptoms. Please refer to After Visit Summary for other counseling recommendations.   I provided 7 minutes of non-face-to-face time during this encounter.  Return in about 1 week (around 12/03/2018) for TELEHEALTH-ROB.  Future Appointments  Date Time Provider Department Center  11/30/2018  2:15 PM [facility] NURSE WH-MFC MFC-US  11/30/2018  2:15 PM WH-MFC [place] 4 WH-MFCUS MFC-US  12/07/2018  7:30 AM [facility] SCHED ROOM [facility] None    Sharyon Cable, CNM Center for [employer], [facility]

## 2018-11-27 ENCOUNTER — Telehealth (HOSPITAL_COMMUNITY): Payer: Self-pay | Admitting: *Deleted

## 2018-11-27 ENCOUNTER — Encounter (HOSPITAL_COMMUNITY): Payer: Self-pay | Admitting: *Deleted

## 2018-11-27 NOTE — Telephone Encounter (Signed)
Preadmission screen  

## 2018-11-30 ENCOUNTER — Ambulatory Visit (HOSPITAL_COMMUNITY): Payer: Medicaid Other | Attending: Obstetrics and Gynecology

## 2018-11-30 ENCOUNTER — Ambulatory Visit (HOSPITAL_COMMUNITY): Payer: Medicaid Other

## 2018-12-02 ENCOUNTER — Other Ambulatory Visit: Payer: Self-pay | Admitting: Advanced Practice Midwife

## 2018-12-03 ENCOUNTER — Other Ambulatory Visit: Payer: Self-pay

## 2018-12-03 ENCOUNTER — Ambulatory Visit (INDEPENDENT_AMBULATORY_CARE_PROVIDER_SITE_OTHER): Payer: Medicaid Other | Admitting: Family Medicine

## 2018-12-03 VITALS — BP 118/77 | HR 73

## 2018-12-03 DIAGNOSIS — O0990 Supervision of high risk pregnancy, unspecified, unspecified trimester: Secondary | ICD-10-CM

## 2018-12-03 DIAGNOSIS — Z3A39 39 weeks gestation of pregnancy: Secondary | ICD-10-CM

## 2018-12-03 DIAGNOSIS — O10919 Unspecified pre-existing hypertension complicating pregnancy, unspecified trimester: Secondary | ICD-10-CM

## 2018-12-03 DIAGNOSIS — O10913 Unspecified pre-existing hypertension complicating pregnancy, third trimester: Secondary | ICD-10-CM

## 2018-12-03 NOTE — Progress Notes (Signed)
   TELEHEALTH VIRTUAL OBSTETRICS VISIT ENCOUNTER NOTE  I connected with Alicia Gay on 12/03/18 at 10:45 AM EDT by telephone at home and verified that I am speaking with the correct person using two identifiers.   I discussed the limitations, risks, security and privacy concerns of performing an evaluation and management service by telephone and the availability of in person appointments. I also discussed with the patient that there may be a patient responsible charge related to this service. The patient expressed understanding and agreed to proceed.  Subjective:  Alicia Gay is a 28 y.o. G2P1001 at [redacted]w[redacted]d being followed for ongoing prenatal care.  She is currently monitored for the following issues for this high-risk pregnancy and has History of gestational diabetes in prior pregnancy, currently pregnant; Chronic hypertension; Supervision of high risk pregnancy, antepartum; Obesity (BMI 30-39.9); Obesity affecting pregnancy, antepartum; and Chronic hypertension affecting pregnancy on their problem list.  Patient reports no complaints. Reports fetal movement. Denies any contractions, bleeding or leaking of fluid.   The following portions of the patient's history were reviewed and updated as appropriate: allergies, current medications, past family history, past medical history, past social history, past surgical history and problem list.   Objective:  Blood pressure 118/77, pulse 73, last menstrual period 03/31/2018, currently breastfeeding.   General:  Alert, oriented and cooperative.   Mental Status: Normal mood and affect perceived. Normal judgment and thought content.  Rest of physical exam deferred due to type of encounter  Assessment and Plan:  Pregnancy: G2P1001 at [redacted]w[redacted]d 1. Chronic hypertension affecting pregnancy BP is well controlled today on no meds For IOL at 40 wks Recent growth on 3/25 is WNL 71%  2. Supervision of high risk pregnancy, antepartum   Preterm labor  symptoms and general obstetric precautions including but not limited to vaginal bleeding, contractions, leaking of fluid and fetal movement were reviewed in detail with the patient.  I discussed the assessment and treatment plan with the patient. The patient was provided an opportunity to ask questions and all were answered. The patient agreed with the plan and demonstrated an understanding of the instructions. The patient was advised to call back or seek an in-person office evaluation/go to MAU at [facility] for any urgent or concerning symptoms. Please refer to After Visit Summary for other counseling recommendations.   I provided 4 minutes of non-face-to-face time during this encounter.  No follow-ups on file.  Future Appointments  Date Time Provider Department Center  12/07/2018  7:30 AM [facility] SCHED ROOM MC-IN[facility] None    Reva Bores, MD Center for [facility], [facility]

## 2018-12-07 ENCOUNTER — Inpatient Hospital Stay (HOSPITAL_COMMUNITY): Payer: Medicaid Other

## 2018-12-07 NOTE — Progress Notes (Signed)
Pt called at 0830 to find out ETA.  Pt states she has decided not to be induced today secondary to her actively having UCs at this time & her doula is with her.  Pt denies concerns or symptoms of PreE at this time.  Pt instructed to come to MAU for her labor precautions or concerns or if SROM d/t GBS + status.  Pt verb understanding.  Dr Macon Large & Artelia Laroche CNM aware. No new orders given.

## 2018-12-15 ENCOUNTER — Encounter (HOSPITAL_COMMUNITY): Payer: Self-pay | Admitting: Student

## 2018-12-15 ENCOUNTER — Other Ambulatory Visit: Payer: Self-pay

## 2018-12-15 ENCOUNTER — Inpatient Hospital Stay (HOSPITAL_COMMUNITY)
Admission: AD | Admit: 2018-12-15 | Discharge: 2018-12-17 | DRG: 807 | Disposition: A | Discharge status: Home / Self Care | Payer: Medicaid Other | Attending: Obstetrics and Gynecology | Admitting: Obstetrics and Gynecology

## 2018-12-15 DIAGNOSIS — O99824 Streptococcus B carrier state complicating childbirth: Secondary | ICD-10-CM | POA: Diagnosis present

## 2018-12-15 DIAGNOSIS — O1002 Pre-existing essential hypertension complicating childbirth: Principal | ICD-10-CM | POA: Diagnosis present

## 2018-12-15 DIAGNOSIS — B951 Streptococcus, group B, as the cause of diseases classified elsewhere: Secondary | ICD-10-CM | POA: Diagnosis present

## 2018-12-15 DIAGNOSIS — E669 Obesity, unspecified: Secondary | ICD-10-CM | POA: Diagnosis present

## 2018-12-15 DIAGNOSIS — Z3A41 41 weeks gestation of pregnancy: Secondary | ICD-10-CM

## 2018-12-15 DIAGNOSIS — O0990 Supervision of high risk pregnancy, unspecified, unspecified trimester: Secondary | ICD-10-CM

## 2018-12-15 DIAGNOSIS — O10919 Unspecified pre-existing hypertension complicating pregnancy, unspecified trimester: Secondary | ICD-10-CM | POA: Diagnosis present

## 2018-12-15 DIAGNOSIS — O99210 Obesity complicating pregnancy, unspecified trimester: Secondary | ICD-10-CM

## 2018-12-15 DIAGNOSIS — O480 Post-term pregnancy: Secondary | ICD-10-CM | POA: Diagnosis present

## 2018-12-15 DIAGNOSIS — O99214 Obesity complicating childbirth: Secondary | ICD-10-CM | POA: Diagnosis present

## 2018-12-15 LAB — CBC
HCT: 38.4 % (ref 36.0–46.0)
Hemoglobin: 12.7 g/dL (ref 12.0–15.0)
MCH: 27.3 pg (ref 26.0–34.0)
MCHC: 33.1 g/dL (ref 30.0–36.0)
MCV: 82.6 fL (ref 80.0–100.0)
Platelets: 204 10*3/uL (ref 150–400)
RBC: 4.65 MIL/uL (ref 3.87–5.11)
RDW: 13.9 % (ref 11.5–15.5)
WBC: 9.2 10*3/uL (ref 4.0–10.5)
nRBC: 0 % (ref 0.0–0.2)

## 2018-12-15 LAB — TYPE AND SCREEN
ABO/RH(D): O POS
Antibody Screen: NEGATIVE

## 2018-12-15 LAB — COMPREHENSIVE METABOLIC PANEL
ALT: 14 U/L (ref 0–44)
AST: 24 U/L (ref 15–41)
Albumin: 3.1 g/dL — ABNORMAL LOW (ref 3.5–5.0)
Alkaline Phosphatase: 110 U/L (ref 38–126)
Anion gap: 10 (ref 5–15)
BUN: 5 mg/dL — ABNORMAL LOW (ref 6–20)
CO2: 20 mmol/L — ABNORMAL LOW (ref 22–32)
Calcium: 9.1 mg/dL (ref 8.9–10.3)
Chloride: 105 mmol/L (ref 98–111)
Creatinine, Ser: 0.6 mg/dL (ref 0.44–1.00)
GFR calc Af Amer: >60 mL/min (ref >60)
GFR calc non Af Amer: >60 mL/min (ref >60)
Glucose, Bld: 86 mg/dL (ref 70–99)
Potassium: 3.8 mmol/L (ref 3.5–5.1)
Sodium: 135 mmol/L (ref 135–145)
Total Bilirubin: 0.6 mg/dL (ref 0.3–1.2)
Total Protein: 6.5 g/dL (ref 6.5–8.1)

## 2018-12-15 LAB — RPR: RPR Ser Ql: NONREACTIVE

## 2018-12-15 LAB — ABO/RH: ABO/RH(D): O POS

## 2018-12-15 LAB — POCT FERN TEST: POCT Fern Test: POSITIVE

## 2018-12-15 MED ORDER — PRENATAL MULTIVITAMIN CH
1.0000 | ORAL_TABLET | Freq: Every day | ORAL | Status: DC
  Administered 2018-12-16 – 2018-12-17 (×2): 1 via ORAL
  Filled 2018-12-15 (×3): qty 1

## 2018-12-15 MED ORDER — ZOLPIDEM TARTRATE 5 MG PO TABS: 5.0000 mg | ORAL_TABLET | Freq: Every evening | ORAL | Status: DC | PRN

## 2018-12-15 MED ORDER — ACETAMINOPHEN 325 MG PO TABS: 650.0000 mg | ORAL_TABLET | ORAL | Status: DC | PRN

## 2018-12-15 MED ORDER — OXYTOCIN 40 UNITS IN NORMAL SALINE INFUSION - SIMPLE MED: 2.5000 [IU]/h | INTRAVENOUS | Status: DC | PRN

## 2018-12-15 MED ORDER — SODIUM CHLORIDE 0.9 % IV SOLN
2.0000 g | Freq: Once | INTRAVENOUS | Status: AC
  Administered 2018-12-15: 2 g via INTRAVENOUS
  Filled 2018-12-15: qty 2000

## 2018-12-15 MED ORDER — ONDANSETRON HCL 4 MG PO TABS: 4.0000 mg | ORAL_TABLET | ORAL | Status: DC | PRN

## 2018-12-15 MED ORDER — DIPHENHYDRAMINE HCL 25 MG PO CAPS: 25.0000 mg | ORAL_CAPSULE | Freq: Four times a day (QID) | ORAL | Status: DC | PRN

## 2018-12-15 MED ORDER — LACTATED RINGERS IV SOLN: INTRAVENOUS | Status: DC

## 2018-12-15 MED ORDER — ONDANSETRON HCL 4 MG/2ML IJ SOLN: 4.0000 mg | INTRAMUSCULAR | Status: DC | PRN

## 2018-12-15 MED ORDER — OXYTOCIN BOLUS FROM INFUSION
500.0000 mL | Freq: Once | INTRAVENOUS | Status: AC
  Administered 2018-12-15: 500 mL via INTRAVENOUS

## 2018-12-15 MED ORDER — OXYTOCIN 10 UNIT/ML IJ SOLN: 10.0000 [IU] | Freq: Once | INTRAMUSCULAR | Status: DC

## 2018-12-15 MED ORDER — COCONUT OIL OIL
1.0000 "application " | TOPICAL_OIL | Status: DC | PRN
  Administered 2018-12-17: 1 via TOPICAL

## 2018-12-15 MED ORDER — OXYTOCIN 10 UNIT/ML IJ SOLN
INTRAMUSCULAR | Status: AC
  Filled 2018-12-15: qty 1

## 2018-12-15 MED ORDER — LIDOCAINE HCL (PF) 1 % IJ SOLN: 30.0000 mL | INTRAMUSCULAR | Status: DC | PRN

## 2018-12-15 MED ORDER — BENZOCAINE-MENTHOL 20-0.5 % EX AERO
1.0000 "application " | INHALATION_SPRAY | CUTANEOUS | Status: DC | PRN
  Administered 2018-12-15: 1 via TOPICAL
  Filled 2018-12-15: qty 56

## 2018-12-15 MED ORDER — WITCH HAZEL-GLYCERIN EX PADS
1.0000 "application " | MEDICATED_PAD | CUTANEOUS | Status: DC | PRN
  Administered 2018-12-15: 1 via TOPICAL

## 2018-12-15 MED ORDER — OXYCODONE-ACETAMINOPHEN 5-325 MG PO TABS: 2.0000 | ORAL_TABLET | ORAL | Status: DC | PRN

## 2018-12-15 MED ORDER — ONDANSETRON HCL 4 MG/2ML IJ SOLN
4.0000 mg | Freq: Four times a day (QID) | INTRAMUSCULAR | Status: DC | PRN
  Administered 2018-12-15: 06:00:00 4 mg via INTRAVENOUS
  Filled 2018-12-15: qty 2

## 2018-12-15 MED ORDER — OXYCODONE-ACETAMINOPHEN 5-325 MG PO TABS: 1.0000 | ORAL_TABLET | ORAL | Status: DC | PRN

## 2018-12-15 MED ORDER — DIBUCAINE (PERIANAL) 1 % EX OINT
1.0000 "application " | TOPICAL_OINTMENT | CUTANEOUS | Status: DC | PRN
  Administered 2018-12-15: 1 via RECTAL
  Filled 2018-12-15: qty 28

## 2018-12-15 MED ORDER — TERBUTALINE SULFATE 1 MG/ML IJ SOLN: 0.2500 mg | Freq: Once | INTRAMUSCULAR | Status: DC | PRN

## 2018-12-15 MED ORDER — OXYCODONE HCL 5 MG PO TABS: 5.0000 mg | ORAL_TABLET | ORAL | Status: DC | PRN

## 2018-12-15 MED ORDER — OXYTOCIN 40 UNITS IN NORMAL SALINE INFUSION - SIMPLE MED
2.5000 [IU]/h | INTRAVENOUS | Status: DC
  Filled 2018-12-15 (×3): qty 1000

## 2018-12-15 MED ORDER — LACTATED RINGERS IV SOLN: 500.0000 mL | INTRAVENOUS | Status: DC | PRN

## 2018-12-15 MED ORDER — SENNOSIDES-DOCUSATE SODIUM 8.6-50 MG PO TABS
2.0000 | ORAL_TABLET | ORAL | Status: DC
  Administered 2018-12-15 – 2018-12-16 (×2): 2 via ORAL
  Filled 2018-12-15 (×2): qty 2

## 2018-12-15 MED ORDER — SOD CITRATE-CITRIC ACID 500-334 MG/5ML PO SOLN: 30.0000 mL | ORAL | Status: DC | PRN

## 2018-12-15 MED ORDER — MISOPROSTOL 25 MCG QUARTER TABLET: 25.0000 ug | ORAL_TABLET | ORAL | Status: DC | PRN

## 2018-12-15 MED ORDER — MAGNESIUM HYDROXIDE 400 MG/5ML PO SUSP: 30.0000 mL | ORAL | Status: DC | PRN

## 2018-12-15 MED ORDER — SIMETHICONE 80 MG PO CHEW: 80.0000 mg | CHEWABLE_TABLET | ORAL | Status: DC | PRN

## 2018-12-15 MED ORDER — IBUPROFEN 600 MG PO TABS
600.0000 mg | ORAL_TABLET | Freq: Four times a day (QID) | ORAL | Status: DC
  Administered 2018-12-15 – 2018-12-17 (×10): 600 mg via ORAL
  Filled 2018-12-15 (×11): qty 1

## 2018-12-15 MED ORDER — TETANUS-DIPHTH-ACELL PERTUSSIS 5-2.5-18.5 LF-MCG/0.5 IM SUSP: 0.5000 mL | Freq: Once | INTRAMUSCULAR | Status: DC

## 2018-12-15 NOTE — MAU Note (Addendum)
Pt here with c/o contractions. Reports good fetal movement. Reports some bleeding and leaking since 0100. Plans for natural childbirth.

## 2018-12-15 NOTE — Lactation Note (Signed)
This note was copied from a baby's chart. Lactation Consultation Note  Patient Name: Alicia Gay Date: 12/15/2018 Reason for consult: Initial assessment;Term Type of Endocrine Disorder?: (hx of GDM/ per mom not this preganncy )  Baby is 5 hours old , O+/ A+ + DAT - at 3 hours Bili 3.0  Has been to the breast 2 x's prior to [facility] consult per mom.  Mom is an experienced BF of 2 years without problems.  Baby awake and hungry. LC reviewed basics and hand expressing, several drops  Spoon fed to baby. Baby showing signs of hunger.  LC assisted mom to latch in the cross cradle to obtain depth/ swallows noted and  Increased with breast compressions/ baby fed for 17 mins and released on her own.  Nipple well rounded. Used firm support. Baby able to open wide by through the feeding  Didn't keep lower lip flanged consistently.  LC instructed mom on the use shells between feedings except when sleeping and  Hand pump for D/C .  Teachable moment - reviewed sore nipple and engorgement prevention and tx  Nutritive vs non - nutritive feeding patterns and the importance of watching the baby  For hanging out latched.  Importance of STS feedings.   Per mom has DEBP at home.  LC reviewed [place] resources for Lactation resources after D/C .     Maternal Data Has patient been taught Hand Expression?: Yes Does the patient have breastfeeding experience prior to this delivery?: Yes  Feeding Feeding Type: Breast Fed  LATCH Score Latch: Grasps breast easily, tongue down, lips flanged, rhythmical sucking.  Audible Swallowing: Spontaneous and intermittent  Type of Nipple: Everted at rest and after stimulation  Comfort (Breast/Nipple): Soft / non-tender  Hold (Positioning): Assistance needed to correctly position infant at breast and maintain latch.  LATCH Score: 9  Interventions Interventions: Breast feeding basics reviewed;Assisted with latch;Skin to skin;Breast massage;Hand  express;Breast compression;Adjust position;Support pillows;Position options;Shells;Hand pump  Lactation Tools Discussed/Used Tools: Pump;Shells Shell Type: Inverted Breast pump type: Manual WIC Program: No Pump Review: Setup, frequency, and cleaning Initiated by:: MAI  Date initiated:: 12/15/18   Consult Status Consult Status: Follow-up Date: 12/16/18 Follow-up type: In-patient    Matilde Sprang Lazer Wollard 12/15/2018, 12:55 PM

## 2018-12-15 NOTE — H&P (Signed)
LABOR AND DELIVERY ADMISSION HISTORY AND PHYSICAL NOTE  Alicia Gay is a 28 y.o. female G2P1001 with IUP at [redacted]w[redacted]d by [facility] presenting for SOL/SROM.   Was scheduled for induction on 4/20, which patient declined. States that she does not have hypertension.   She reports positive fetal movement. She denies leakage of fluid or vaginal bleeding.  Prenatal History/Complications: PNC at [facility] Pregnancy complications:  - cHTN (no meds)  Past Medical History: Past Medical History:  Diagnosis Date  . Gestational diabetes 2017  . Hypertension     Past Surgical History: Past Surgical History:  Procedure Laterality Date  . NO PAST SURGERIES      Obstetrical History: OB History    Gravida  2   Para  1   Term  1   Preterm      AB      Living  1     SAB      TAB      Ectopic      Multiple  0   Live Births  1           Social History: Social History   Socioeconomic History  . Marital status: Single    Spouse name: Not on file  . Number of children: Not on file  . Years of education: Not on file  . Highest education level: Not on file  Occupational History  . Not on file  Social Needs  . Financial resource strain: Not hard at all  . Food insecurity:    Worry: Never true    Inability: Never true  . Transportation needs:    Medical: No    Non-medical: Not on file  Tobacco Use  . Smoking status: Never Smoker  . Smokeless tobacco: Never Used  Substance and Sexual Activity  . Alcohol use: No    Comment: prior to preg  . Drug use: No  . Sexual activity: Not Currently    Birth control/protection: None    Comment: pregnant   Lifestyle  . Physical activity:    Days per week: Not on file    Minutes per session: Not on file  . Stress: Not at all  Relationships  . Social connections:    Talks on phone: Not on file    Gets together: Not on file    Attends religious service: Not on file    Active member of club or organization: Not on file     Attends meetings of clubs or organizations: Not on file    Relationship status: Not on file  Other Topics Concern  . Not on file  Social History Narrative  . Not on file    Family History: Family History  Problem Relation Age of Onset  . Cancer Maternal Aunt   . Diabetes Paternal Grandmother     Allergies: No Known Allergies  Medications Prior to Admission  Medication Sig Dispense Refill Last Dose  . Prenat w/o A-FeCbn-Meth-FA-DHA (PRENATE MINI) 29-0.6-0.4-350 MG CAPS Take 1 capsule by mouth daily before breakfast. 90 capsule 3 12/14/2018 at Unknown time  . sodium chloride (OCEAN) 0.65 % SOLN nasal spray Place 1 spray into both nostrils as needed for congestion.   Past Month at Unknown time     Review of Systems  All systems reviewed and negative except as stated in HPI  Physical Exam Blood pressure 121/83, pulse 76, temperature 98.2 F (36.8 C), temperature source Oral, resp. rate 20, height 5\' 7"  (1.702 m), weight 98 kg, last menstrual  period 03/31/2018, SpO2 97 %, currently breastfeeding. General appearance: alert, oriented, NAD Lungs: normal respiratory effort Heart: regular rate Abdomen: soft, non-tender; gravid, FH appropriate for GA Extremities: No calf swelling or tenderness Presentation: cephalic Fetal monitoring: baseline 140/mod variability/+ acels/no decels Uterine activity: q1535m Dilation: 4.5 Effacement (%): 80 Station: -2 Exam by:: Scientist, product/process developmentAmber Stovall RN  Prenatal labs: ABO, Rh: O/Positive/-- (11/22 1117) Antibody: Negative (11/22 1117) Rubella: 11.20 (11/22 1117) RPR: Non Reactive (01/28 1055)  HBsAg: Negative (11/22 1117)  HIV: Non Reactive (01/28 1055)  GC/Chlamydia: negative GBS:   positive 2-hr GTT: 69/105/95 Genetic screening:  none Anatomy US: normal  Prenatal Transfer Tool  Maternal Diabetes: No Genetic Screening: Declined Maternal Ultrasounds/Referrals: Normal Fetal Ultrasounds or other Referrals:  None Maternal Substance Abuse:   No Significant Maternal Medications:  None Significant Maternal Lab Results: None  No results found for this or any previous visit (from the past 24 hour(s)).  Patient Active Problem List   Diagnosis Date Noted  . Chronic hypertension affecting pregnancy 09/15/2018  . Obesity (BMI 30-39.9) 08/26/2018  . Obesity affecting pregnancy, antepartum 08/26/2018  . Supervision of high risk pregnancy, antepartum 06/25/2018  . Chronic hypertension 07/16/2016  . History of gestational diabetes in prior pregnancy, currently pregnant 06/04/2016    Assessment: Alicia Gay is a 28 y.o. G2P1001 at 6755w2d here for SOL/SROM  #Labor: progressing. Expectant management #Pain: Maternal support #FWB:  Cat 1 #ID:  GBS(+) - ampicillin ordered #MOF: breastfeeding #MOC: undecided  #Circ:  NA  Alicia Piontek,MD 12/15/2018, 4:25 AM

## 2018-12-16 LAB — CBC
HCT: 32.2 % — ABNORMAL LOW (ref 36.0–46.0)
Hemoglobin: 10.4 g/dL — ABNORMAL LOW (ref 12.0–15.0)
MCH: 27.2 pg (ref 26.0–34.0)
MCHC: 32.3 g/dL (ref 30.0–36.0)
MCV: 84.1 fL (ref 80.0–100.0)
Platelets: 186 10*3/uL (ref 150–400)
RBC: 3.83 MIL/uL — ABNORMAL LOW (ref 3.87–5.11)
RDW: 14.1 % (ref 11.5–15.5)
WBC: 10.7 10*3/uL — ABNORMAL HIGH (ref 4.0–10.5)
nRBC: 0 % (ref 0.0–0.2)

## 2018-12-16 NOTE — Progress Notes (Signed)
Post Partum Day 1  Subjective: no complaints, up ad lib, voiding and tolerating PO  Objective: Blood pressure 115/73, pulse 87, temperature 98 F (36.7 C), temperature source Oral, resp. rate 16, height 5\' 7"  (1.702 m), weight 98 kg, last menstrual period 03/31/2018, SpO2 100 %, unknown if currently breastfeeding.  Physical Exam:  General: alert, cooperative and no distress Lochia: appropriate Uterine Fundus: firm Incision: n/a DVT Evaluation: No evidence of DVT seen on physical exam.  Recent Labs    12/15/18 0447 12/16/18 0614  HGB 12.7 10.4*  HCT 38.4 32.2*    Assessment/Plan: Plan for discharge tomorrow and Breastfeeding  Baby only got one dose of antibiotic right before delivery, so d/c tomorrow   LOS: 1 day   Wynelle Bourgeois 12/16/2018, 7:53 AM

## 2018-12-17 MED ORDER — IBUPROFEN 600 MG PO TABS: 600.0000 mg | ORAL_TABLET | Freq: Four times a day (QID) | ORAL | 0 refills | Status: AC | PRN

## 2018-12-17 NOTE — Lactation Note (Signed)
This note was copied from a baby's chart. Lactation Consultation Note  Patient Name: Alicia Gay [id number] Date: 12/17/2018 Reason for consult: Follow-up assessment Baby is 51 hours old/3% weight loss.  Mom reports that baby is latching and feeding well.  Reviewed milk coming to volume and the prevention and treatment of engorgement with LC on initial visit.  Questions answered.  Lactation outpatient services and support reviewed and encouraged prn.  Maternal Data    Feeding Feeding Type: Breast Fed  LATCH Score Latch: Grasps breast easily, tongue down, lips flanged, rhythmical sucking.  Audible Swallowing: Spontaneous and intermittent  Type of Nipple: Everted at rest and after stimulation  Comfort (Breast/Nipple): Soft / non-tender  Hold (Positioning): No assistance needed to correctly position infant at breast.  LATCH Score: 10  Interventions    Lactation Tools Discussed/Used     Consult Status Consult Status: Complete Follow-up type: Call as needed    Huston Foley 12/17/2018, 10:17 AM

## 2018-12-17 NOTE — Discharge Summary (Signed)
OB Discharge Summary     Patient Name: Alicia Gay DOB: 02/20/1991 MRN: [id number]  Date of admission: 12/15/2018 Delivering MD: Conan BowensAVIS, KELLY M   Date of discharge: 12/17/2018  Admitting diagnosis: 6441 WKS, CTX Intrauterine pregnancy: 6845w2d     Secondary diagnosis:  Active Problems:   Chronic hypertension during pregnancy   Group beta Strep positive  Additional problems: none     Discharge diagnosis: Term Pregnancy Delivered and CHTN                                                                                                Post partum procedures:none  Augmentation: none  Complications: None  Hospital course:  Onset of Labor With Vaginal Delivery     28 y.o. yo [id number] at 3545w2d was admitted in Latent Labor on 12/15/2018. Patient had an uncomplicated labor course, progressing within 4 hrs to SVD. She received Amp x 1 dose for GBS prophylaxis prior to delivery.  Membrane Rupture Time/Date: 1:00 PM ,12/15/2018   Intrapartum Procedures: Episiotomy: None [1]                                         Lacerations:  Periurethral [8]  Patient had a delivery of a Viable infant. 12/15/2018  Information for the patient's newborn:  Bing Reeettiford, Moriah Tenay [[id number]][[id number]]  Delivery Method: Vaginal, Spontaneous(Filed from Delivery Summary)    She received a dx of cHTN during early pregnancy and never required medication. She declined an induction of labor when it was [facility] Patient had an uncomplicated postpartum course. She remained normotensive throughout her stay. She is ambulating, tolerating a regular diet, passing flatus, and urinating well. Patient is discharged home in stable condition on 12/17/18.   Physical exam  Vitals:   12/16/18 0511 12/16/18 1416 12/17/18 0115 12/17/18 0530  BP: 115/73 116/83 105/66 110/72  Pulse: 87 66 (!) 51 (!) 57  Resp: 16 16 18 16   Temp: 98 F (36.7 C) 98 F (36.7 C) 98.4 F (36.9 C) 97.9 F (36.6 C)  TempSrc: Oral Oral Axillary  Oral  SpO2: 100%   100%  Weight:      Height:       General: alert and cooperative Lochia: appropriate Uterine Fundus: firm Incision: N/A DVT Evaluation: No evidence of DVT seen on physical exam. Labs: Lab Results  Component Value Date   WBC 10.7 (H) 12/16/2018   HGB 10.4 (L) 12/16/2018   HCT 32.2 (L) 12/16/2018   MCV 84.1 12/16/2018   PLT 186 12/16/2018   CMP Latest Ref Rng & Units 12/15/2018  Glucose 70 - 99 mg/dL 86  BUN 6 - 20 mg/dL 5(L)  Creatinine 1.300.44 - 1.00 mg/dL 8.650.60  Sodium 784135 - 696145 mmol/L 135  Potassium 3.5 - 5.1 mmol/L 3.8  Chloride 98 - 111 mmol/L 105  CO2 22 - 32 mmol/L 20(L)  Calcium 8.9 - 10.3 mg/dL 9.1  Total Protein 6.5 - 8.1 g/dL 6.5  Total Bilirubin 0.3 - 1.2 mg/dL 0.6  Alkaline Phos 38 - 126 U/L 110  AST 15 - 41 U/L 24  ALT 0 - 44 U/L 14    Discharge instruction: per After Visit Summary and "Baby and Me Booklet".  After visit meds:  Allergies as of 12/17/2018   No Known Allergies     Medication List    TAKE these medications   ibuprofen 600 MG tablet Commonly known as:  ADVIL Take 1 tablet (600 mg total) by mouth every 6 (six) hours as needed.   Prenate Mini 29-0.6-0.4-350 MG Caps Take 1 capsule by mouth daily before breakfast.   sodium chloride 0.65 % Soln nasal spray Commonly known as:  OCEAN Place 1 spray into both nostrils as needed for congestion.       Diet: routine diet  Activity: Advance as tolerated. Pelvic rest for 6 weeks.   Outpatient follow up:1 wk for BP check, then 4 wk pp visit Follow up Appt: Future Appointments  Date Time Provider Department Center  12/21/2018  1:15 PM [facility] NURSE [facility] None  01/13/2019  1:15 PM Constant, Peggy, MD [facility] None   Follow up Visit:No follow-ups on file.  Postpartum contraception: Undecided  Newborn Data: Live born female  Birth Weight: 7 lb 10.8 oz (3480 g) APGAR: 9, 9  Newborn Delivery   Birth date/time:  12/15/2018 06:59:00 Delivery type:  Vaginal, Spontaneous      Baby Feeding: Breast Disposition:home with mother   12/17/2018 Arabella Merles, CNM

## 2018-12-17 NOTE — Discharge Instructions (Signed)
Vaginal Delivery, Care After °Refer to this sheet in the next few weeks. These instructions provide you with information about caring for yourself after vaginal delivery. Your health care provider may also give you more specific instructions. Your treatment has been planned according to current medical practices, but problems sometimes occur. Call your health care provider if you have any problems or questions. °What can I expect after the procedure? °After vaginal delivery, it is common to have: °· Some bleeding from your vagina. °· Soreness in your abdomen, your vagina, and the area of skin between your vaginal opening and your anus (perineum). °· Pelvic cramps. °· Fatigue. °Follow these instructions at home: °Medicines °· Take over-the-counter and prescription medicines only as told by your health care provider. °· If you were prescribed an antibiotic medicine, take it as told by your health care provider. Do not stop taking the antibiotic until it is finished. °Driving ° °· Do not drive or operate heavy machinery while taking prescription pain medicine. °· Do not drive for 24 hours if you received a sedative. °Lifestyle °· Do not drink alcohol. This is especially important if you are breastfeeding or taking medicine to relieve pain. °· Do not use tobacco products, including cigarettes, chewing tobacco, or e-cigarettes. If you need help quitting, ask your health care provider. °Eating and drinking °· Drink at least 8 eight-ounce glasses of water every day unless you are told not to by your health care provider. If you choose to breastfeed your baby, you may need to drink more water than this. °· Eat high-fiber foods every day. These foods may help prevent or relieve constipation. High-fiber foods include: °? Whole grain cereals and breads. °? Brown rice. °? Beans. °? Fresh fruits and vegetables. °Activity °· Return to your normal activities as told by your health care provider. Ask your health care provider what  activities are safe for you. °· Rest as much as possible. Try to rest or take a nap when your baby is sleeping. °· Do not lift anything that is heavier than your baby or 10 lb (4.5 kg) until your health care provider says that it is safe. °· Talk with your health care provider about when you can engage in sexual activity. This may depend on your: °? Risk of infection. °? Rate of healing. °? Comfort and desire to engage in sexual activity. °Vaginal Care °· If you have an episiotomy or a vaginal tear, check the area every day for signs of infection. Check for: °? More redness, swelling, or pain. °? More fluid or blood. °? Warmth. °? Pus or a bad smell. °· Do not use tampons or douches until your health care provider says this is safe. °· Watch for any blood clots that may pass from your vagina. These may look like clumps of dark red, brown, or black discharge. °General instructions °· Keep your perineum clean and dry as told by your health care provider. °· Wear loose, comfortable clothing. °· Wipe from front to back when you use the toilet. °· Ask your health care provider if you can shower or take a bath. If you had an episiotomy or a perineal tear during labor and delivery, your health care provider may tell you not to take baths for a certain length of time. °· Wear a bra that supports your breasts and fits you well. °· If possible, have someone help you with household activities and help care for your baby for at least a few days after you   leave the hospital. °· Keep all follow-up visits for you and your baby as told by your health care provider. This is important. °Contact a health care provider if: °· You have: °? Vaginal discharge that has a bad smell. °? Difficulty urinating. °? Pain when urinating. °? A sudden increase or decrease in the frequency of your bowel movements. °? More redness, swelling, or pain around your episiotomy or vaginal tear. °? More fluid or blood coming from your episiotomy or vaginal  tear. °? Pus or a bad smell coming from your episiotomy or vaginal tear. °? A fever. °? A rash. °? Little or no interest in activities you used to enjoy. °? Questions about caring for yourself or your baby. °· Your episiotomy or vaginal tear feels warm to the touch. °· Your episiotomy or vaginal tear is separating or does not appear to be healing. °· Your breasts are painful, hard, or turn red. °· You feel unusually sad or worried. °· You feel nauseous or you vomit. °· You pass large blood clots from your vagina. If you pass a blood clot from your vagina, save it to show to your health care provider. Do not flush blood clots down the toilet without having your health care provider look at them. °· You urinate more than usual. °· You are dizzy or light-headed. °· You have not breastfed at all and you have not had a menstrual period for 12 weeks after delivery. °· You have stopped breastfeeding and you have not had a menstrual period for 12 weeks after you stopped breastfeeding. °Get help right away if: °· You have: °? Pain that does not go away or does not get better with medicine. °? Chest pain. °? Difficulty breathing. °? Blurred vision or spots in your vision. °? Thoughts about hurting yourself or your baby. °· You develop pain in your abdomen or in one of your legs. °· You develop a severe headache. °· You faint. °· You bleed from your vagina so much that you fill two sanitary pads in one hour. °This information is not intended to replace advice given to you by your health care provider. Make sure you discuss any questions you have with your health care provider. °Document Released: 08/02/2000 Document Revised: 01/17/2016 Document Reviewed: 08/20/2015 °Elsevier Interactive Patient Education © 2019 [employer] Inc[employer] ° °

## 2018-12-18 DIAGNOSIS — B951 Streptococcus, group B, as the cause of diseases classified elsewhere: Secondary | ICD-10-CM | POA: Diagnosis present

## 2018-12-21 ENCOUNTER — Ambulatory Visit: Payer: Medicaid Other

## 2018-12-29 ENCOUNTER — Telehealth: Payer: Self-pay

## 2019-01-13 ENCOUNTER — Other Ambulatory Visit: Payer: Self-pay

## 2019-01-13 ENCOUNTER — Encounter: Payer: Self-pay | Admitting: Obstetrics and Gynecology

## 2019-01-13 ENCOUNTER — Ambulatory Visit (INDEPENDENT_AMBULATORY_CARE_PROVIDER_SITE_OTHER): Payer: Medicaid Other | Admitting: Obstetrics and Gynecology

## 2019-01-13 MED ORDER — DROSPIRENONE 4 MG PO TABS: 1.0000 | ORAL_TABLET | Freq: Every day | ORAL | 4 refills | Status: AC

## 2019-01-13 NOTE — Progress Notes (Deleted)
  Subjective:     Alicia Gay is a 28 y.o. female who presents for a postpartum visit. She is {1-10:13787} {time; units:18646} postpartum following a {delivery:12449}. I have fully reviewed the prenatal and intrapartum course. The delivery was at *** gestational weeks. Outcome: {delivery outcome:32078}. Anesthesia: {anesthesia types:812}. Postpartum course has been ***. Baby's course has been ***. Baby is feeding by {breast/bottle:69}. Bleeding {vag bleed:12292}. Bowel function is {normal:32111}. Bladder function is {normal:32111}. Patient {is/is not:9024} sexually active. Contraception method is {contraceptive method:5051}. Postpartum depression screening: {neg default:13464::"negative"}.  {Common ambulatory SmartLinks:19316}  Review of Systems {ros; complete:30496}   Objective:    LMP 03/31/2018 (Approximate)   General:  {gen appearance:16600}   Breasts:  {breast exam:1202::"inspection negative, no nipple discharge or bleeding, no masses or nodularity palpable"}  Lungs: {lung exam:16931}  Heart:  {heart exam:5510}  Abdomen: {abdomen exam:16834}   Vulva:  {labia exam:12198}  Vagina: {vagina exam:12200}  Cervix:  {cervix exam:14595}  Corpus: {uterus exam:12215}  Adnexa:  {adnexa exam:12223}  Rectal Exam: {rectal/vaginal exam:12274}        Assessment:    *** postpartum exam. Pap smear {done:10129} at today's visit.   Plan:    1. Contraception: {method:5051} 2. *** 3. Follow up in: {1-10:13787} {time; units:19136} or as needed.

## 2019-01-13 NOTE — Progress Notes (Signed)
    TELEHEALTH POSTPARTUM VIRTUAL VIDEO VISIT ENCOUNTER NOTE  Provider location: [facility] [place]   I connected with Alicia Gay on 01/13/19 at  1:15 PM EDT by WebEx Video Encounter at home and verified that I am speaking with the correct person using two identifiers.    I discussed the limitations, risks, security and privacy concerns of performing an evaluation and management service by telephone and the availability of in person appointments. I also discussed with the patient that there may be a patient responsible charge related to this service. The patient expressed understanding and agreed to proceed.  Appointment Date: 01/13/2019  Chief Complaint: Postpartum Visit  History of Present Illness: Alicia Gay is a 28 y.o. African-American G2P2002 being evaluated for postpartum followup.    She is s/p normal spontaneous vaginal delivery on 12/15/18 at 41 weeks; she was discharged to home on 12/17/18 . Pregnancy complicated by history of CHTN without medication. Baby is doing well. Patient is without complaints  Vaginal bleeding or discharge: Yes  Intercourse: No  Contraception: oral progesterone-only contraceptive Mode of feeding infant: Breast PP depression s/s: No .  Any bowel or bladder issues: No  Pap smear: no abnormalities (date: 07/29/18)  Review of Systems: Her 12 point review of systems is negative or as noted in the History of Present Illness.  Patient Active Problem List   Diagnosis Date Noted  . Group beta Strep positive 12/18/2018  . Chronic hypertension during pregnancy 12/15/2018  . Chronic hypertension affecting pregnancy 09/15/2018  . Obesity (BMI 30-39.9) 08/26/2018  . Obesity affecting pregnancy, antepartum 08/26/2018  . Supervision of high risk pregnancy, antepartum 06/25/2018    Medications Alicia Gay had no medications administered during this visit. Current Outpatient Medications  Medication Sig Dispense Refill  .  ibuprofen (ADVIL) 600 MG tablet Take 1 tablet (600 mg total) by mouth every 6 (six) hours as needed. 30 tablet 0  . Prenat w/o A-FeCbn-Meth-FA-DHA (PRENATE MINI) 29-0.6-0.4-350 MG CAPS Take 1 capsule by mouth daily before breakfast. 90 capsule 3  . sodium chloride (OCEAN) 0.65 % SOLN nasal spray Place 1 spray into both nostrils as needed for congestion.     No current facility-administered medications for this visit.     Allergies Patient has no known allergies.  Physical Exam:  LMP 03/31/2018 (Approximate)   General:  Alert, oriented and cooperative. Patient is in no acute distress.  Mental Status: Normal mood and affect. Normal behavior. Normal judgment and thought content.   Respiratory: Normal respiratory effort noted, no problems with respiration noted  Rest of physical exam deferred due to type of encounter  PP Depression Screening:  negative  Assessment:Patient is a 28 y.o. T2W5809 who is 4 weeks postpartum from a normal spontaneous vaginal delivery.  She is doing very well.   Plan: Patient is medically cleared to resume all activities of daily living She plans to start progesterone only birth control pills for contraception  RTC 07/2019 for annual exam  I discussed the assessment and treatment plan with the patient. The patient was provided an opportunity to ask questions and all were answered. The patient agreed with the plan and demonstrated an understanding of the instructions.   The patient was advised to call back or seek an in-person evaluation/go to the ED for any concerning postpartum symptoms.  I provided 15 minutes of face-to-face time during this encounter.   Frutoso Chase, RN [facility], [facility]

## 2020-12-15 IMAGING — US US MFM OB DETAIL+14 WK
1 series · 13 of 28 positions shown · non-contrast
Comparison: none

[Series 1: us mfm ob detail+14 wk · 13 of 128 slices shown]
[im 5/128]
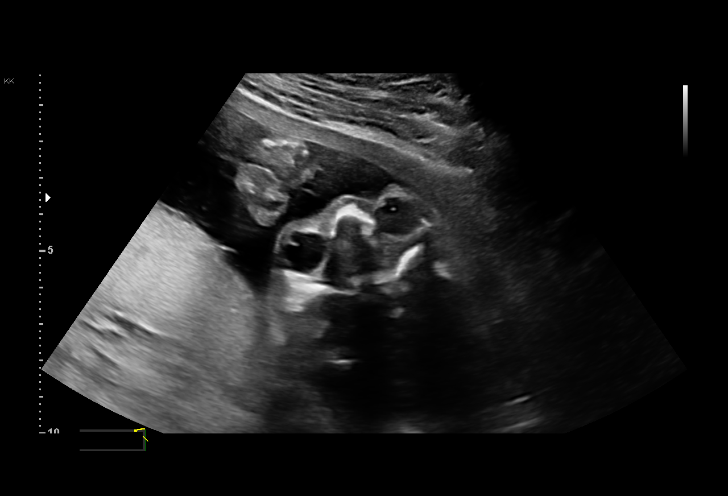
[im 15/128]
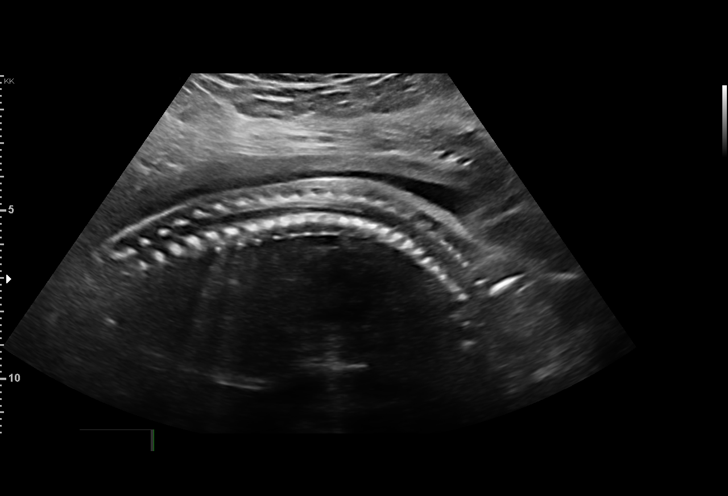
[im 24/128]
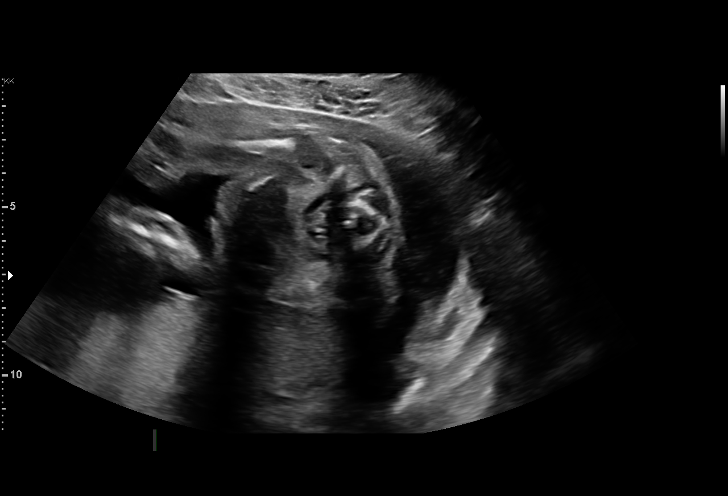
[im 33/128]
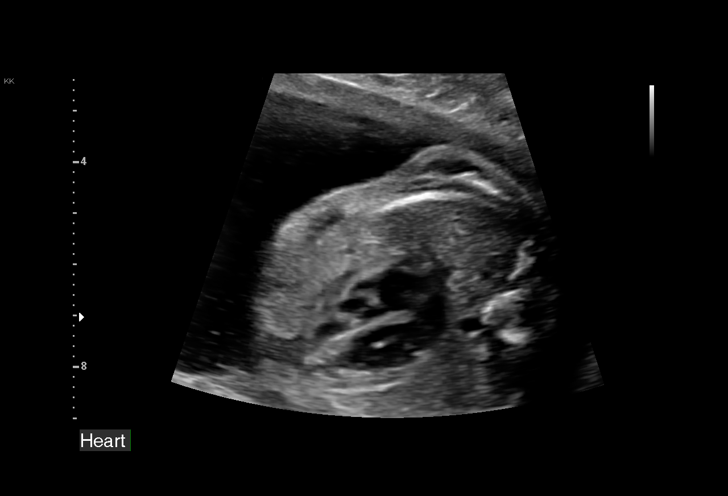
[im 43/128]
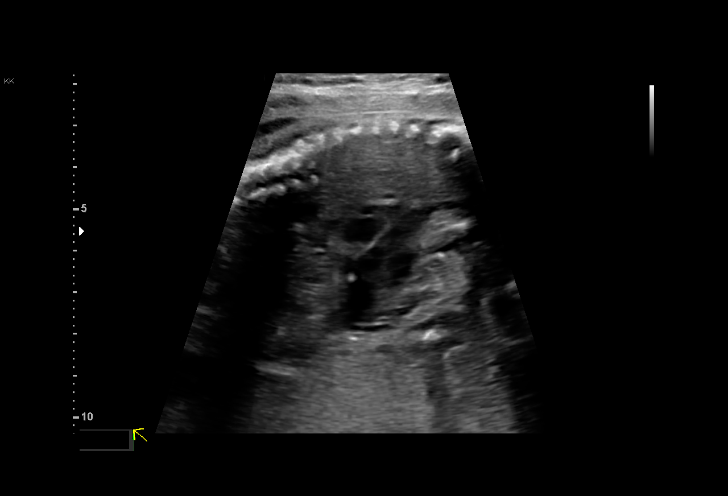
[im 52/128]
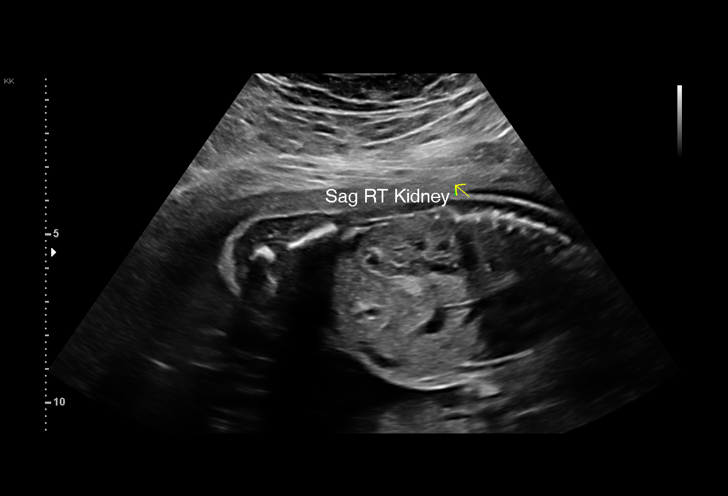
[im 66/128]
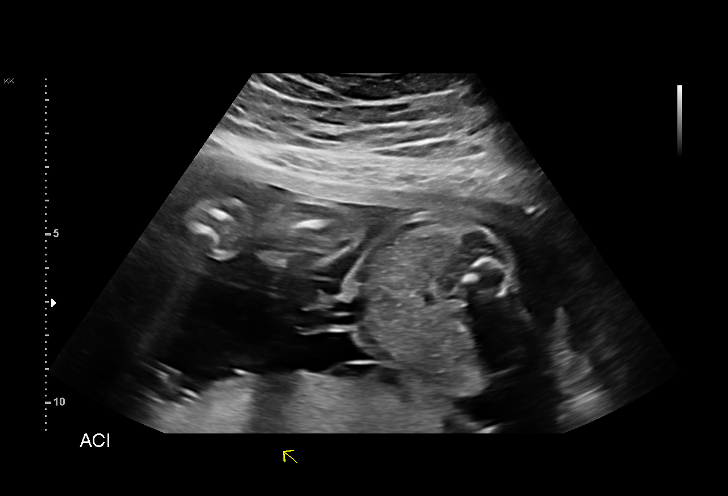
[im 76/128]
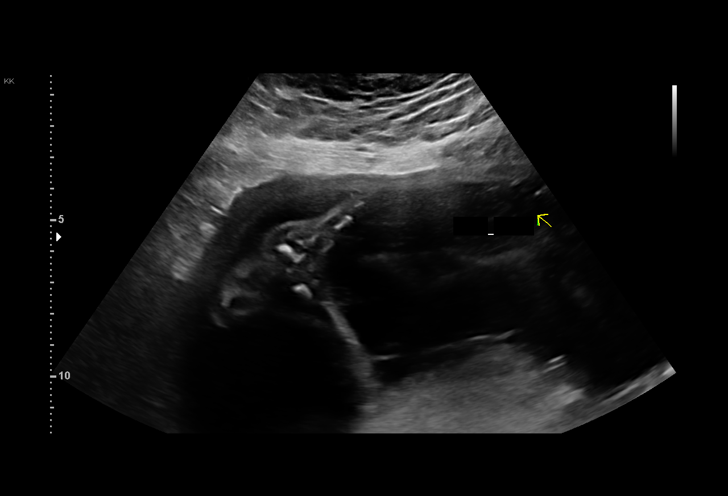
[im 85/128]
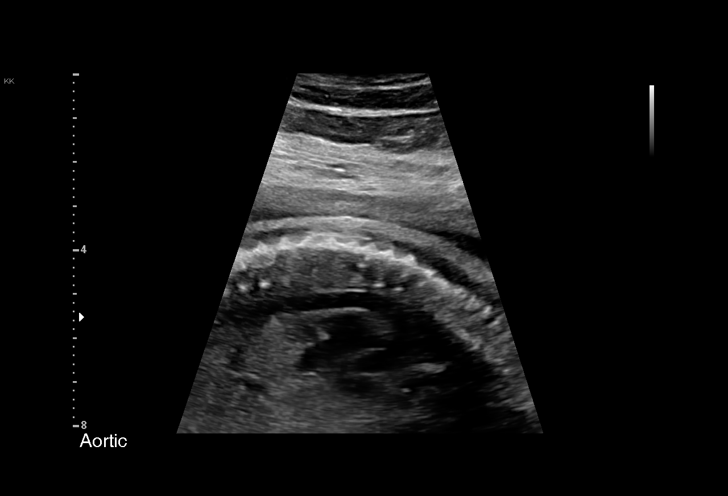
[im 95/128]
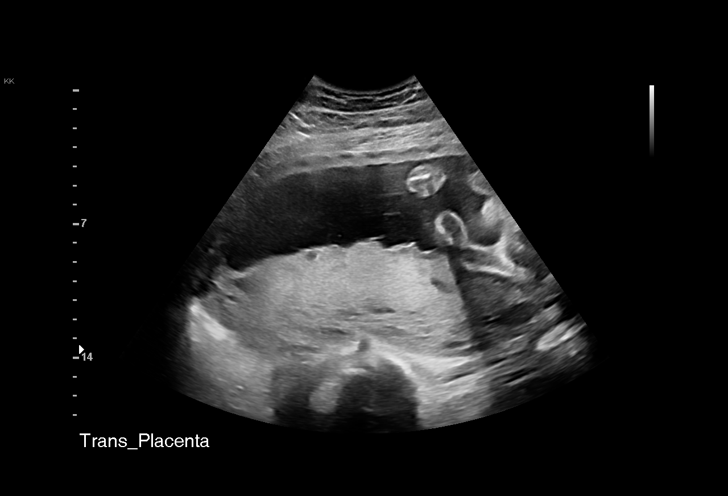
[im 104/128]
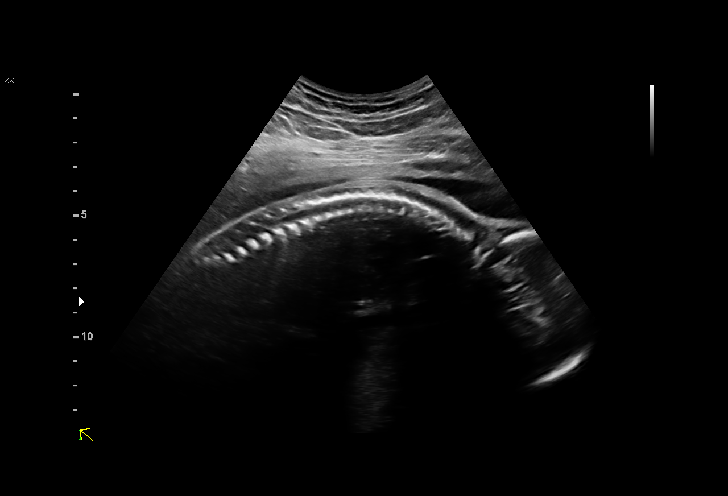
[im 113/128]
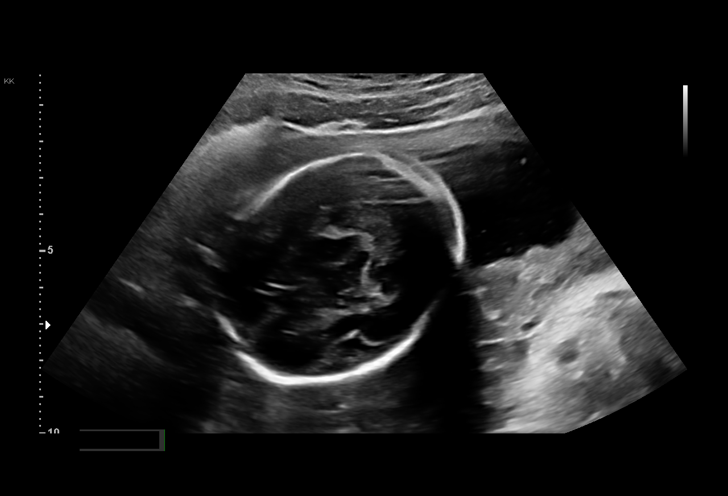
[im 123/128]
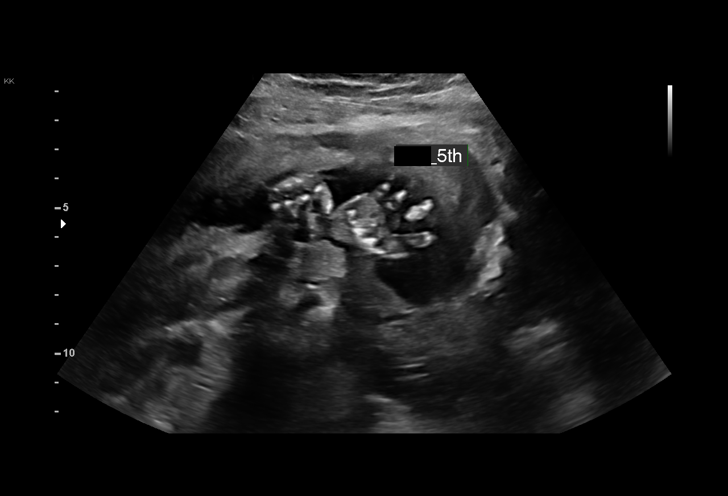

[13 of 28 positions shown; findings below may reference images not displayed]

[REDACTED]care -
 Attending:        Mb Salem El Rayyan      Address:          [REDACTED]
                   [facility]
 Ref. Address:     Faculty

 ----------------------------------------------------------------------

 ----------------------------------------------------------------------
Indications

  Hypertension - Chronic/Pre-existing
  Encounter for antenatal screening for
  malformations
  Encounter for uncertain dates
  23 weeks gestation of pregnancy
  Poor obstetric history: Previous gestational
  diabetes
 ----------------------------------------------------------------------
Fetal Evaluation

 Num Of Fetuses:         1
 Fetal Heart Rate(bpm):  151
 Cardiac Activity:       Observed
 Presentation:           Cephalic
 Placenta:               Posterior
 P. Cord Insertion:      Visualized

 Amniotic Fluid
 AFI FV:      Within normal limits

                             Largest Pocket(cm)

Biometry

 BPD:      55.8  mm     G. Age:  23w 0d         20  %    CI:        71.69   %    70 - 86
                                                         FL/HC:      20.2   %    18.7 -
 HC:      209.8  mm     G. Age:  23w 1d         14  %    HC/AC:      1.03        1.05 -
 AC:      204.5  mm     G. Age:  25w 0d         80  %    FL/BPD:     76.0   %    71 - 87
 FL:       42.4  mm     G. Age:  23w 6d         42  %    FL/AC:      20.7   %    20 - 24
 HUM:      39.8  mm     G. Age:  24w 2d         54  %

 Est. FW:     682  gm      1 lb 8 oz     63  %
OB History

 Gravidity:    2         Term:   1        Prem:   0        SAB:   0
 TOP:          0       Ectopic:  0        Living: 1
Gestational Age

 LMP:           19w 3d        Date:  03/31/18                 EDD:   01/05/19
 U/S Today:     23w 5d                                        EDD:   12/06/18
 Best:          23w 5d     Det. By:  U/S (08/14/18)           EDD:   12/06/18
Anatomy

 Cranium:               Appears normal         Aortic Arch:            Appears normal
 Cavum:                 Appears normal         Ductal Arch:            Not well visualized
 Ventricles:            Appears normal         Diaphragm:              Appears normal
 Choroid Plexus:        Appears normal         Stomach:                Appears normal, left
                                                                       sided
 Cerebellum:            Appears normal         Abdomen:                Appears normal
 Posterior Fossa:       Appears normal         Abdominal Wall:         Appears nml (cord
                                                                       insert, abd wall)
 Nuchal Fold:           Not applicable (>20    Cord Vessels:           Appears normal (3
                        wks GA)                                        vessel cord)
 Face:                  Appears normal         Kidneys:                Appear normal
                        (orbits and profile)
 Lips:                  Appears normal         Bladder:                Appears normal
 Thoracic:              Appears normal         Spine:                  Appears normal
 Heart:                 Appears normal         Upper Extremities:      Appears normal
                        (4CH, axis, and situs
 RVOT:                  Not well visualized    Lower Extremities:      Appears normal
 LVOT:                  Not well visualized

 Other:  Normal genaitalia. Parents do not wish to know sex of fetus. Heels
         and 5th digit visualized. Open hands visualized. Nasal bone
         visualized.
Cervix Uterus Adnexa

 Cervix
 Length:            3.9  cm.
 Normal appearance by transabdominal scan.
Comments

 U/S images reviewed.  Findings reviewed with patient.  EDC
 is based on today's measurements (12/06/2018)..  No fetal
 abnormalities are identified.
 Questions answered.
 10 minutes spent face to face with patient.
 Recommendations: 1) Serial U/S every 4 weeks for fetal
 growth 2) Weekly BPP beginning @ 36 weeks
Recommendations

 1) Serial U/S every 4 weeks for fetal growth 2) Weekly BPP
 beginning @ 36 weeks
              Sojo, Morima

## 2021-03-07 IMAGING — US US MFM FETAL BPP W/NONSTRESS
1 series · 15 of 28 positions shown · non-contrast
Comparison: none

[Series 1: us mfm fetal bpp w/nonstress · 32 acquisitions, 15 frames shown]
[im 1/32]
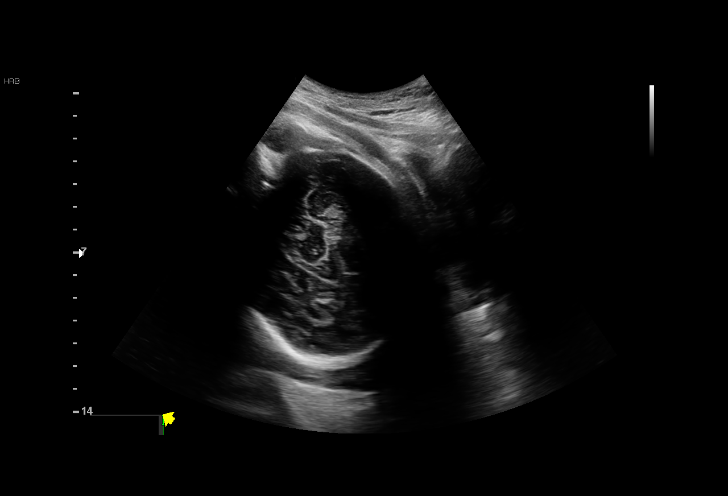
[im 3/32]
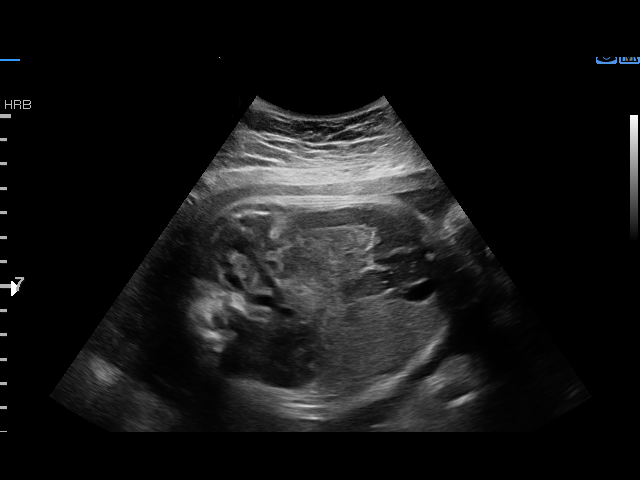
[im 5/32]
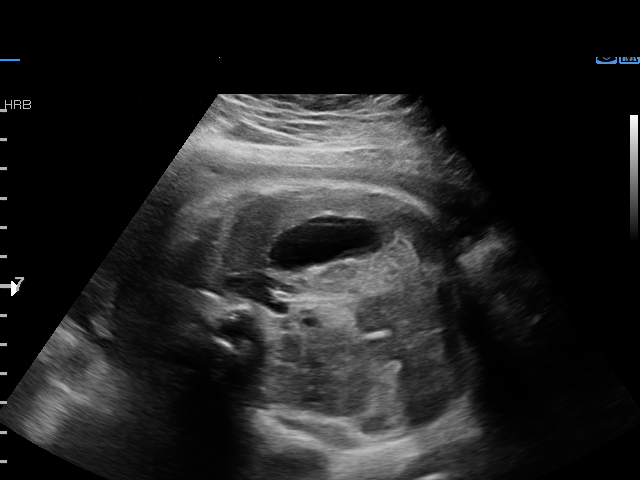
[im 7/32]
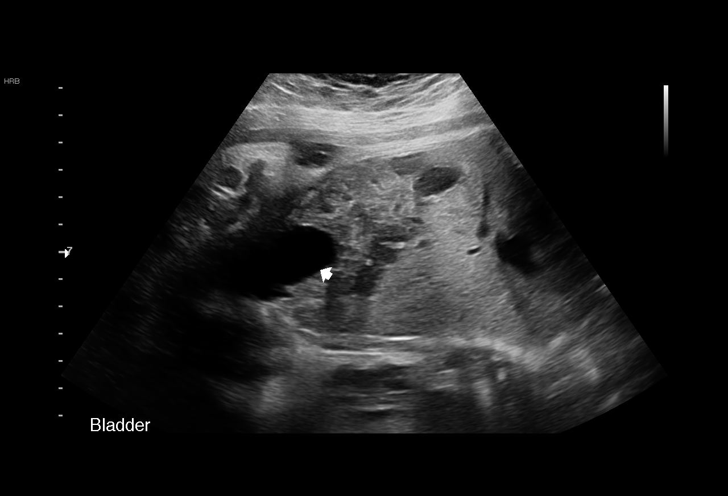
[im 10/32]
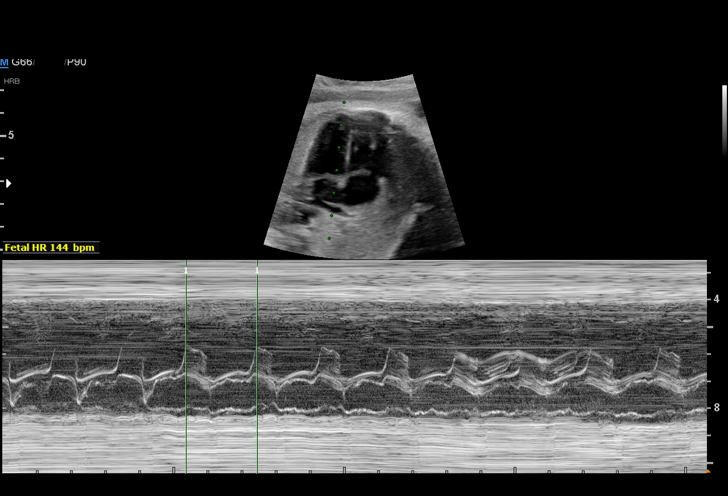
[im 12/32]
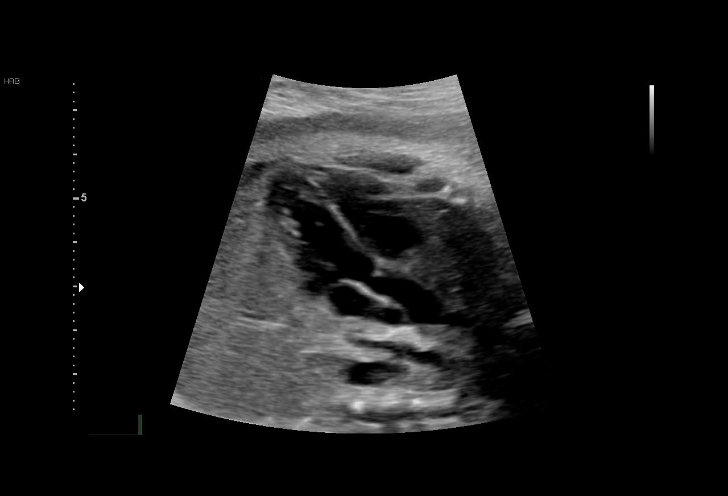
[im 14/32]
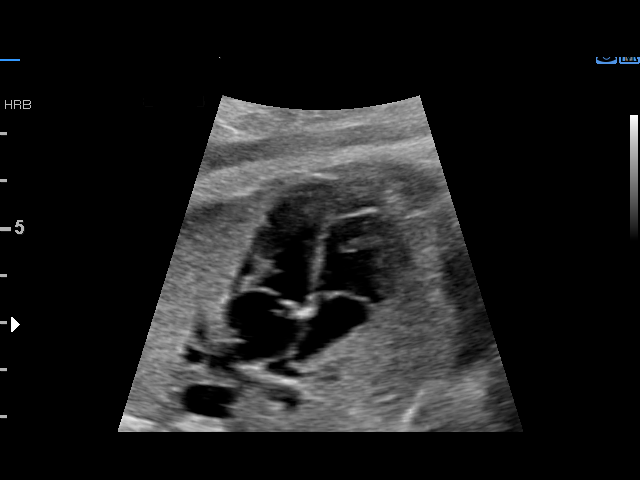
[im 17/32]
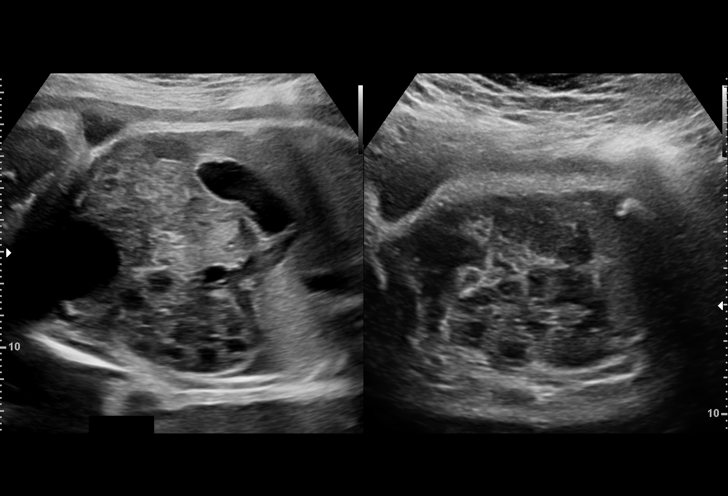
[im 18/32]
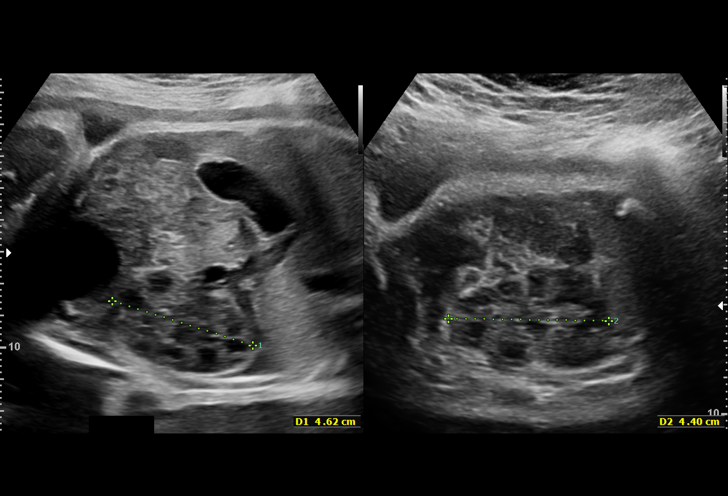
[im 20/32]
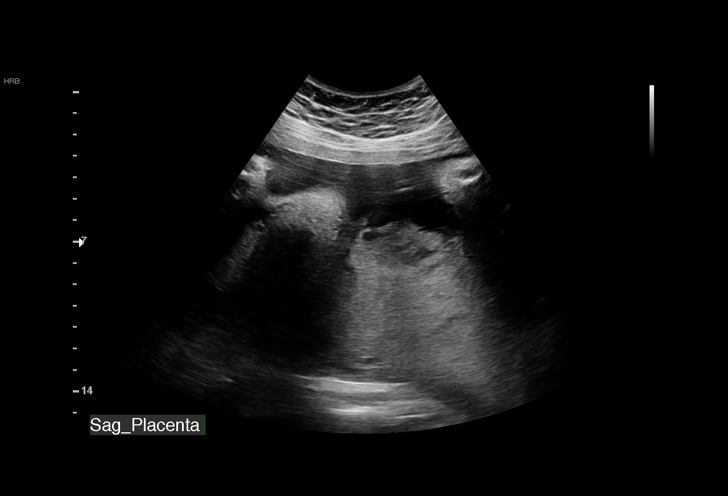
[im 22/32]
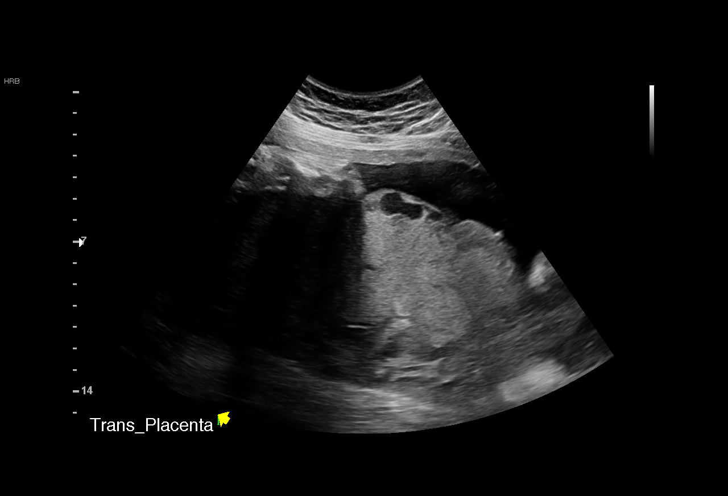
[im 25/32]
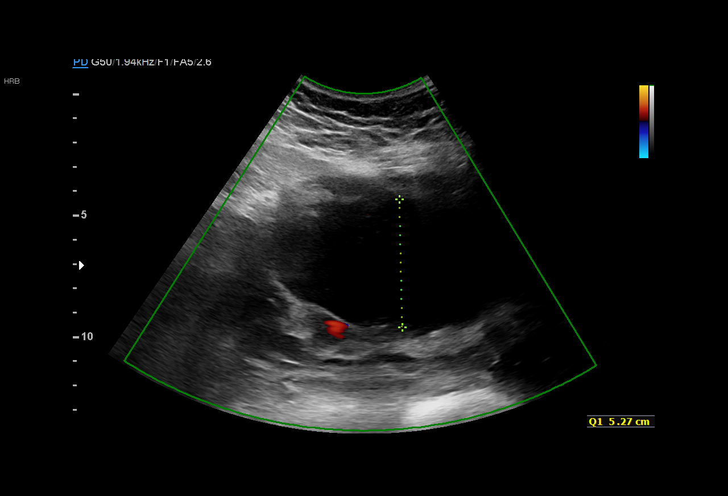
[im 27/32]
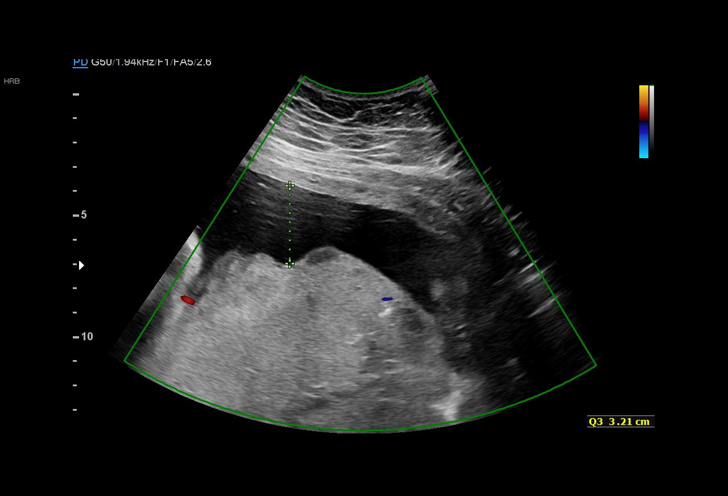
[im 29/32]
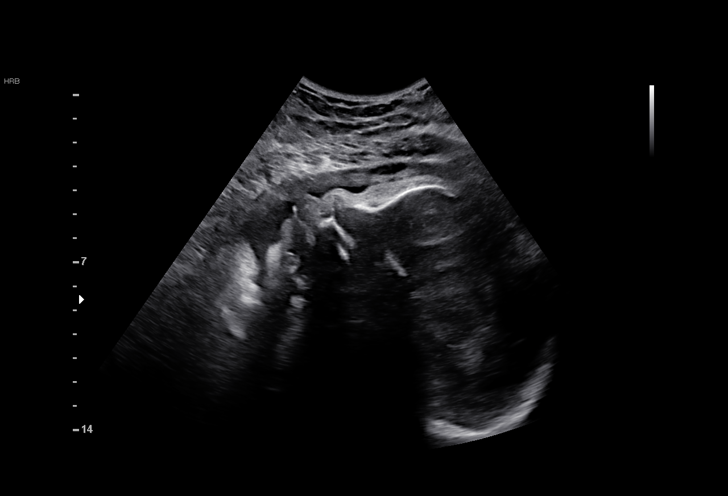
[im 32/32]
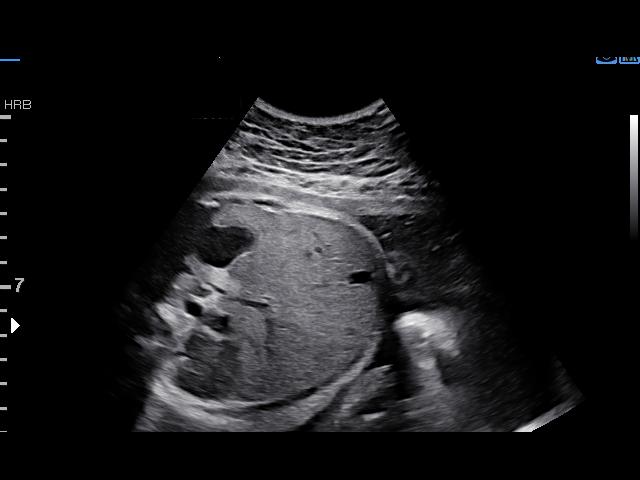

[15 of 28 positions shown; findings below may reference images not displayed]

Healthcare -
                    [facility]                                [HOSPITAL]
 Ref. Address:      Faculty

 ----------------------------------------------------------------------

 ----------------------------------------------------------------------
Indications

  Abnormal finding on antenatal screening
  Hypertension - Chronic/Pre-existing
  Poor obstetric history: Previous gestational
  diabetes
  Encounter for other antenatal screening
  follow-up
  35 weeks gestation of pregnancy
 ----------------------------------------------------------------------
Vital Signs

 BMI:
Fetal Evaluation

 Num Of Fetuses:          1
 Fetal Heart Rate(bpm):   144
 Cardiac Activity:        Observed
 Presentation:            Cephalic
 Placenta:                Posterior

 Amniotic Fluid
 AFI FV:      Within normal limits

 AFI Sum(cm)     %Tile       Largest Pocket(cm)
 18              67
 RUQ(cm)       RLQ(cm)        LUQ(cm)        LLQ(cm)

Biophysical Evaluation

 Amniotic F.V:   Within normal limits        F. Tone:         Observed
 F. Movement:    Not Observed                N.S.T:           Nonreactive
 F. Breathing:   Observed                    Score:           [DATE]
OB History

 Gravidity:     2         Term:  1          Prem:  0        SAB:   0
 TOP:           0       Ectopic: 0         Living: 1
Gestational Age

 LMP:            31w 1d       Date:  03/31/18                   EDD:  01/05/19
 Best:           35w 3d    Det. By:  U/S  (08/14/18)            EDD:  12/06/18
Anatomy

 Thoracic:               Appears normal         Stomach:                Appears normal, left
                                                                        sided
 Heart:                  Appears normal         Abdomen:                Appears normal
                         (4CH, axis, and situs)
 LVOT:                   Appears normal         Kidneys:                Appear normal
 Diaphragm:              Appears normal         Bladder:                Appears normal
Cervix Uterus Adnexa

 Cervix
 Not visualized (advanced GA >12wks)
Impression

 Chronic Hypertension
 Biophyiscal profile [DATE] (-2 movement;-2 NRNST))
 Occasional premature atrial contractios
Recommendations

 Repeat BPP in 1 week
 TO TIGER
